# Patient Record
Sex: Female | Born: 2000
Health system: Southern US, Community
[De-identification: ages and names within clinical notes are randomized; demographics above are authoritative.]

## PROBLEM LIST (undated history)

## (undated) DIAGNOSIS — F32A Depression, unspecified: Secondary | ICD-10-CM

## (undated) DIAGNOSIS — J45909 Unspecified asthma, uncomplicated: Secondary | ICD-10-CM

## (undated) DIAGNOSIS — F419 Anxiety disorder, unspecified: Secondary | ICD-10-CM

## (undated) DIAGNOSIS — N83209 Unspecified ovarian cyst, unspecified side: Secondary | ICD-10-CM

## (undated) DIAGNOSIS — G8929 Other chronic pain: Secondary | ICD-10-CM

## (undated) DIAGNOSIS — R102 Pelvic and perineal pain unspecified side: Secondary | ICD-10-CM

## (undated) DIAGNOSIS — E282 Polycystic ovarian syndrome: Secondary | ICD-10-CM

## (undated) HISTORY — DX: Pelvic and perineal pain unspecified side: R10.20

## (undated) HISTORY — DX: Other chronic pain: G89.29

## (undated) HISTORY — DX: Anxiety disorder, unspecified: F41.9

## (undated) HISTORY — PX: ADENOIDECTOMY: SUR15

## (undated) HISTORY — PX: TONSILLECTOMY: SUR1361

## (undated) HISTORY — PX: OVARIAN CYST SURGERY: SHX726

## (undated) HISTORY — DX: Depression, unspecified: F32.A

## (undated) HISTORY — DX: Unspecified ovarian cyst, unspecified side: N83.209

---
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
## 2021-01-12 MED FILL — DULOXETINE HCL 60 MG CPEP: 60 | 30 days supply | Qty: 30 | Fill #6

## 2021-01-13 ENCOUNTER — Other Ambulatory Visit (HOSPITAL_BASED_OUTPATIENT_CLINIC_OR_DEPARTMENT_OTHER): Payer: Self-pay

## 2021-01-17 ENCOUNTER — Encounter: Payer: Self-pay | Admitting: Medical-Surgical

## 2021-01-17 ENCOUNTER — Telehealth: Payer: Self-pay

## 2021-01-17 NOTE — Telephone Encounter (Signed)
Pt aware of Beth Holmes's response. I asked if she would like to get worked in on [facility] schedule tomorrow and she declined. I told her that we would be able to address the injury and HA, but she really needed to contact her GYN about the heavy bleeding. She said that when she called GYN they told her to call her provider in [place] since they will be doing surgery, but states when she called them they told her to see [place]. I told her we could see her tomorrow for the head injury and HA but she really needed to reach out to Gyn. Pt again declined scheduling appt with Beth Holmes tomorrow. No further questions or concerns at this time.

## 2021-01-17 NOTE — Telephone Encounter (Signed)
Pt called and stated that she fell yesterday around noon and hit her head. She states she had a HA, but then around 4:30 PM she started having heavy menstrual bleeding. She has not missed a period but states she did have unprotected sex last week. Her next cycle is supposed to start around 01/20/2021. She said that she is passing clots, and has bleed through a tampon, a pad, and onto her pants. She states she has gone through a pad within the last hour. She is also complaining of abdominal pain. I asked her and she said she did call her GYN and they told her to call [facility] and they then instructed her to call her PCP due to hitting her head. Pt states she is not able to call out of work because "they will not believe me". I told her that there were not any appointments available this afternoon, and [facility] did not have anything available tomorrow, but I would send an urgent message to [facility] and see what she says.

## 2021-01-17 NOTE — Telephone Encounter (Signed)
We can certainly see her for the fall and hitting her head. OB/GYN should be seeing her for the excessive bleeding. These are two separate issues that are unfortunately occurring at the same time. The fall and head bump are not the likely cause her heavy bleeding issues. If she needs to come in for evaluation, we can work her in tomorrow. I would urge her to call OB/GYN back as they are going to have more input on the vaginal bleeding issue.

## 2021-01-18 NOTE — Telephone Encounter (Signed)
This encounter was created in error - please disregard.

## 2021-01-19 ENCOUNTER — Emergency Department (HOSPITAL_COMMUNITY)
Admission: EM | Admit: 2021-01-19 | Discharge: 2021-01-20 | Disposition: A | Discharge status: Home / Self Care | Payer: Managed Care, Other (non HMO) | Attending: Emergency Medicine | Admitting: Emergency Medicine

## 2021-01-19 ENCOUNTER — Ambulatory Visit (INDEPENDENT_AMBULATORY_CARE_PROVIDER_SITE_OTHER): Payer: Managed Care, Other (non HMO)

## 2021-01-19 ENCOUNTER — Other Ambulatory Visit: Payer: Self-pay

## 2021-01-19 ENCOUNTER — Encounter (HOSPITAL_COMMUNITY): Payer: Self-pay | Admitting: Emergency Medicine

## 2021-01-19 ENCOUNTER — Other Ambulatory Visit: Payer: Self-pay | Admitting: Obstetrics and Gynecology

## 2021-01-19 DIAGNOSIS — S0990XA Unspecified injury of head, initial encounter: Secondary | ICD-10-CM | POA: Diagnosis present

## 2021-01-19 DIAGNOSIS — W01198A Fall on same level from slipping, tripping and stumbling with subsequent striking against other object, initial encounter: Secondary | ICD-10-CM | POA: Diagnosis not present

## 2021-01-19 DIAGNOSIS — N939 Abnormal uterine and vaginal bleeding, unspecified: Secondary | ICD-10-CM | POA: Insufficient documentation

## 2021-01-19 DIAGNOSIS — J45909 Unspecified asthma, uncomplicated: Secondary | ICD-10-CM | POA: Diagnosis not present

## 2021-01-19 DIAGNOSIS — Y92002 Bathroom of unspecified non-institutional (private) residence single-family (private) house as the place of occurrence of the external cause: Secondary | ICD-10-CM | POA: Insufficient documentation

## 2021-01-19 DIAGNOSIS — S060X0A Concussion without loss of consciousness, initial encounter: Secondary | ICD-10-CM

## 2021-01-19 LAB — CBC WITH DIFFERENTIAL/PLATELET
Abs Immature Granulocytes: 0.02 10*3/uL (ref 0.00–0.07)
Basophils Absolute: 0 10*3/uL (ref 0.0–0.1)
Basophils Relative: 0 %
Eosinophils Absolute: 0.1 10*3/uL (ref 0.0–0.5)
Eosinophils Relative: 1 %
HCT: 38.4 % (ref 36.0–46.0)
Hemoglobin: 12.3 g/dL (ref 12.0–15.0)
Immature Granulocytes: 0 %
Lymphocytes Relative: 40 %
Lymphs Abs: 3.5 10*3/uL (ref 0.7–4.0)
MCH: 25.7 pg — ABNORMAL LOW (ref 26.0–34.0)
MCHC: 32 g/dL (ref 30.0–36.0)
MCV: 80.3 fL (ref 80.0–100.0)
Monocytes Absolute: 0.8 10*3/uL (ref 0.1–1.0)
Monocytes Relative: 9 %
Neutro Abs: 4.4 10*3/uL (ref 1.7–7.7)
Neutrophils Relative %: 50 %
Platelets: 285 10*3/uL (ref 150–400)
RBC: 4.78 MIL/uL (ref 3.87–5.11)
RDW: 13.2 % (ref 11.5–15.5)
WBC: 8.7 10*3/uL (ref 4.0–10.5)
nRBC: 0 % (ref 0.0–0.2)

## 2021-01-19 LAB — I-STAT BETA HCG BLOOD, ED (MC, WL, AP ONLY): I-stat hCG, quantitative: <5 m[IU]/mL (ref <5)

## 2021-01-19 LAB — COMPREHENSIVE METABOLIC PANEL
ALT: 30 U/L (ref 0–44)
AST: 20 U/L (ref 15–41)
Albumin: 3.6 g/dL (ref 3.5–5.0)
Alkaline Phosphatase: 69 U/L (ref 38–126)
Anion gap: 9 (ref 5–15)
BUN: 14 mg/dL (ref 6–20)
CO2: 24 mmol/L (ref 22–32)
Calcium: 8.8 mg/dL — ABNORMAL LOW (ref 8.9–10.3)
Chloride: 106 mmol/L (ref 98–111)
Creatinine, Ser: 0.69 mg/dL (ref 0.44–1.00)
GFR, Estimated: >60 mL/min (ref >60)
Glucose, Bld: 87 mg/dL (ref 70–99)
Potassium: 3.5 mmol/L (ref 3.5–5.1)
Sodium: 139 mmol/L (ref 135–145)
Total Bilirubin: 0.3 mg/dL (ref 0.3–1.2)
Total Protein: 6.6 g/dL (ref 6.5–8.1)

## 2021-01-19 LAB — TYPE AND SCREEN
ABO/RH(D): A POS
Antibody Screen: NEGATIVE

## 2021-01-19 NOTE — ED Triage Notes (Signed)
Emergency Medicine Provider Triage Evaluation Note  Beth Holmes , a 20 y.o. female  was evaluated in triage.  Pt complains of dizziness, lightheadedness and vaginal bleeding.  Had a mechanical fall about 4 days ago in her shower.  Larey Seat backwards and hit her head does not know if she lost consciousness.  Since then she has had heavy vaginal bleeding with passing clots.  States that her menstrual cycle started about 3 days early.  Reports abdominal cramping.  Review of Systems  Positive: Lightheadedness, vaginal bleeding Negative: Blurry vision  Physical Exam  BP (!) 151/111 (BP Location: Left Arm)   Pulse (!) 122   Temp 98.1 F (36.7 C) (Oral)   Resp 18   Ht 5\' 3"  (1.6 m)   Wt 95.3 kg   LMP 12/20/2020   SpO2 97%   BMI 37.20 kg/m  Gen:   Awake, no distress   HEENT:  Atraumatic  Resp:  Normal effort  Cardiac:  Tachycardic Abd:   Nondistended, nontender  MSK:   Moves extremities without difficulty Neuro:  Speech clear, strength 5/5 in bilateral upper and lower extremities no facial asymmetry  Medical Decision Making  Medically screening exam initiated at 8:55 PM.  Appropriate orders placed.  Beth Holmes was informed that the remainder of the evaluation will be completed by another provider, this initial triage assessment does not replace that evaluation, and the importance of remaining in the ED until their evaluation is complete.  Clinical Impression  20 year old female presenting to the ED after head injury 4 days ago.  Since then she had heavy vaginal bleeding.  Reports abdominal cramping as well.  No gross neurological deficits.  She had a pelvic ultrasound today which I reviewed which showed no abnormalities, endometrial stripe measuring 5.2 mm in thickness.  Will obtain lab work, orthostatic vital signs.   15, PA-C 01/19/21 2106

## 2021-01-19 NOTE — ED Triage Notes (Signed)
Pt states that she fell and hit the back of her head on Monday. Does not remember the fall and "thinks that she passed out." That night she started bleeding vaginally. Reports that it is much heavier than her normal period. States that she is going through a pad per hour and seeing a lot of clots. Reports that she still feels "woozy" as well.

## 2021-01-20 ENCOUNTER — Ambulatory Visit (HOSPITAL_COMMUNITY): Admission: RE | Admit: 2021-01-20 | Payer: Managed Care, Other (non HMO) | Source: Ambulatory Visit

## 2021-01-20 ENCOUNTER — Other Ambulatory Visit (HOSPITAL_BASED_OUTPATIENT_CLINIC_OR_DEPARTMENT_OTHER): Payer: Self-pay

## 2021-01-20 LAB — WET PREP, GENITAL
Clue Cells Wet Prep HPF POC: NONE SEEN
Sperm: NONE SEEN
Trich, Wet Prep: NONE SEEN
Yeast Wet Prep HPF POC: NONE SEEN

## 2021-01-20 LAB — CBG MONITORING, ED: Glucose-Capillary: 82 mg/dL (ref 70–99)

## 2021-01-20 LAB — ABO/RH: ABO/RH(D): A POS

## 2021-01-20 MED ORDER — ONDANSETRON HCL 4 MG PO TABS
4.0000 mg | ORAL_TABLET | Freq: Three times a day (TID) | ORAL | 0 refills | Status: DC | PRN
  Filled 2021-01-20: qty 10, 4d supply, fill #0

## 2021-01-20 MED ORDER — ONDANSETRON 4 MG PO TBDP
4.0000 mg | ORAL_TABLET | Freq: Once | ORAL | Status: AC
  Administered 2021-01-20: 4 mg via ORAL
  Filled 2021-01-20: qty 1

## 2021-01-20 MED ORDER — KETOROLAC TROMETHAMINE 60 MG/2ML IM SOLN
60.0000 mg | Freq: Once | INTRAMUSCULAR | Status: AC
  Administered 2021-01-20: 60 mg via INTRAMUSCULAR
  Filled 2021-01-20: qty 2

## 2021-01-20 NOTE — Discharge Instructions (Addendum)
1. Medications: Ibuprofen or Tylenol for pain °2. Treatment: Rest, ice on head.  Concussion precautions given - keep patient in a quiet, not simulating, dark environment. No TV, computer use, video games until headache is resolved completely. No contact sports until cleared by the pediatrician. °3. Follow Up: With primary care physician on Monday if headache persists.  Return to the emergency department if patient becomes lethargic, begins vomiting, develops double vision, speech difficulty, problems walking or other change in mental status. ° °

## 2021-01-20 NOTE — ED Notes (Signed)
Discharged no concerns 

## 2021-01-20 NOTE — ED Provider Notes (Addendum)
[facility]-EMERGENCY DEPT Provider Note   CSN: [id number] Arrival date & time: 01/19/21  2036     History Chief Complaint  Patient presents with  . Head Injury  . Vaginal Bleeding    Beth Holmes is a 20 y.o. female presents to the Emergency Department complaining of gradual, persistent, vaginal bleeding onset 01/17/2021. Associated symptoms include lower abdominal cramping. Pt also with N/V/D on 4/6 which she attributed to a gastroenteritis.  This has resolved and no additional vomiting. No aggravating factors.  Alleviating factors include heating pad, motrin.  Pt denies fever, chills, neck pain, chest pain, SOB, dysuria, syncope.  Pt reports she slipped in the bathroom and fell on 4/5, hitting the back of her head.  No LOC.  Pt reports after headaches, dizziness, photophobia and it has been hard to focus.  No vision changes or diplopia.  Pt denies blood thinner.  No vomiting aside from the nausea vomiting diarrhea that she attributed to gastroenteritis the day after the fall.  Patient's mother provides additional history.   The history is provided by the patient, medical records and a parent. No language interpreter was used.       Past Medical History:  Diagnosis Date  . Anxiety   . Asthma   . Chronic pelvic pain in female   . Depression   . Ovarian cyst    rt side    Patient Active Problem List   Diagnosis Date Noted  . 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 Bladder pain 05/08/2020  . Anxiety 05/08/2020  . Chronic female pelvic pain 12/06/2019  . Metrorrhagia 03/21/2016  . Aphthous ulcer of mouth 12/03/2015  . Obesity, unspecified 09/17/2007    Past Surgical History:  Procedure Laterality Date  . ADENOIDECTOMY    . OVARIAN CYST SURGERY    . TONSILLECTOMY    . UPPER GASTROINTESTINAL ENDOSCOPY  08/11/2020     OB History    Gravida  0   Para  0   Term  0   Preterm  0   AB  0   Living  0     SAB  0   IAB  0    Ectopic  0   Multiple  0   Live Births  0           Family History  Problem Relation Age of Onset  . Thyroid disease Father   . Lung cancer Father   . Throat cancer Father   . Bone cancer Father   . Diabetes Maternal Grandmother   . Hypertension Maternal Grandmother   . Diabetes Maternal Grandfather   . Hypertension Maternal Grandfather   . Hypertension Paternal Grandmother   . Colon cancer Paternal Grandmother   . Hypertension Paternal Grandfather   . Ovarian cancer Paternal Aunt   . Crohn's disease Neg Hx   . Inflammatory bowel disease Neg Hx   . Food intolerance Neg Hx   . Celiac disease Neg Hx   . GER disease Neg Hx   . Migraines Neg Hx   . Esophageal cancer Neg Hx   . Stomach cancer Neg Hx   . Rectal cancer Neg Hx     Social History   Tobacco Use  . Smoking status: Never Smoker  . Smokeless tobacco: Never Used  Vaping Use  . Vaping Use: Never used  Substance Use Topics  . Alcohol use: Never  . Drug use: Never    Home Medications Prior to  Admission medications   Medication Sig Start Date End Date Taking? Authorizing Provider  DULoxetine (CYMBALTA) 60 MG capsule Take 60 mg by mouth daily.  06/26/20  Yes [provider]  ibuprofen (ADVIL,MOTRIN) 200 MG tablet Take 600 mg by mouth every 6 (six) hours as needed for mild pain.   Yes [provider]  ondansetron (ZOFRAN) 4 MG tablet Take 1 tablet (4 mg total) by mouth every 8 (eight) hours as needed for nausea or vomiting. 01/20/21  Yes Alajah Witman, Dahlia Client, PA-C  pantoprazole (PROTONIX) 40 MG tablet TAKE 1 TABLET (40 MG TOTAL) BY MOUTH 2 (TWO) TIMES DAILY. TAKE 40MG  TWICE DAILY FOR 6 WEEKS, THEN REDUCE TO ONCE DAILY 08/11/20 08/11/21 Yes Cirigliano, Vito V, DO  valACYclovir (VALTREX) 500 MG tablet TAKE 1 TABLET BY MOUTH ONCE DAILY 06/26/20 06/26/21 Yes 06/28/21, MD  dicyclomine (BENTYL) 10 MG capsule TAKE 1 CAPSULE BY MOUTH EVERY 8 HOURS AS NEEDED FOR SPASMS Patient not taking: Reported on  01/20/2021 08/01/20 08/01/21  Cirigliano, Vito V, DO  DULoxetine (CYMBALTA) 60 MG capsule TAKE 1 CAPSULE BY MOUTH ONCE DAILY Patient not taking: Reported on 01/20/2021 06/26/20 06/26/21  06/28/21, MD  Elagolix Sodium 200 MG TABS TAKE 1 TABLET BY MOUTH TWICE DAILY Patient not taking: Reported on 01/20/2021 07/17/20 07/17/21  Anyanwu, 09/16/21, MD  famotidine (PEPCID) 20 MG tablet TAKE 1 TABLET BY MOUTH 2 TIMES DAILY **NEED OFFICE VISIT FOR FURTHER REFILLS** Patient not taking: Reported on 01/20/2021 10/12/20 10/12/21  10/14/21, NP  gabapentin (NEURONTIN) 300 MG capsule TAKE 1 CAPSULE BY MOUTH AT NIGHT AND INCREASE AS TOLERATED AS DIRECTED Patient not taking: Reported on 01/20/2021 08/14/20 08/14/21  13/1/22, MD  Lidocaine HCl 2.5 % GEL Apply 1 application topically as needed. Patient not taking: Reported on 01/20/2021 02/03/20   02/05/20, MD  Meth-Hyo-M Lesly Dukes Phos-Ph Sal (URO-MP) 118 MG CAPS Take 1 capsule by mouth daily as needed. Patient not taking: Reported on 01/20/2021 06/23/20   [provider]  metroNIDAZOLE (FLAGYL) 500 MG tablet TAKE 1 TABLET (500 MG TOTAL) BY MOUTH 2 (TWO) TIMES DAILY FOR 7 DAYS. Patient not taking: Reported on 01/20/2021 11/28/20 11/28/21  11/30/21, NP  metroNIDAZOLE (METROGEL) 0.75 % vaginal gel APPLY ONE APPLICATORFUL TO VAGINA AT BEDTIME FOR 7 DAYS THEN TWICE A WEEK. Patient not taking: Reported on 01/20/2021 12/28/20 12/28/21  12/30/21, MD  pantoprazole (PROTONIX) 40 MG tablet TAKE 1 TABLET (40 MG TOTAL) BY MOUTH DAILY. 06/15/20 06/15/21  08/15/21, NP  promethazine (PHENERGAN) 12.5 MG tablet Take 1 tablet (12.5 mg total) by mouth every 8 (eight) hours as needed for nausea or vomiting. Patient not taking: Reported on 01/20/2021 06/15/20   08/15/20, NP  norethindrone (AYGESTIN) 5 MG tablet Take 1 tablet (5 mg total) by mouth daily. 05/08/20 10/23/20  12/21/20, MD    Allergies    Patient has no known allergies.  Review of Systems   Review  of Systems  Constitutional: Negative for appetite change, diaphoresis, fatigue, fever and unexpected weight change.  HENT: Negative for mouth sores.   Eyes: Positive for photophobia. Negative for visual disturbance.  Respiratory: Negative for cough, chest tightness, shortness of breath and wheezing.   Cardiovascular: Negative for chest pain.  Gastrointestinal: Positive for diarrhea, nausea and vomiting. Negative for abdominal pain and constipation.  Endocrine: Negative for polydipsia, polyphagia and polyuria.  Genitourinary: Positive for pelvic pain and vaginal bleeding. Negative for dysuria, frequency, hematuria and  urgency.  Musculoskeletal: Negative for back pain and neck stiffness.  Skin: Negative for rash.  Allergic/Immunologic: Negative for immunocompromised state.  Neurological: Positive for light-headedness and headaches. Negative for syncope.  Hematological: Does not bruise/bleed easily.  Psychiatric/Behavioral: Negative for sleep disturbance. The patient is not nervous/anxious.     Physical Exam Updated Vital Signs BP 111/78   Pulse 88   Temp 98.1 F (36.7 C) (Oral)   Resp 20   Ht 5\' 3"  (1.6 m)   Wt 95.3 kg   SpO2 100%   BMI 37.20 kg/m   Physical Exam Vitals and nursing note reviewed. Exam conducted with a chaperone present.  Constitutional:      General: She is not in acute distress.    Appearance: She is well-developed. She is not diaphoretic.  HENT:     Head: Normocephalic and atraumatic.  Eyes:     General: No scleral icterus.    Conjunctiva/sclera: Conjunctivae normal.     Pupils: Pupils are equal, round, and reactive to light.     Comments: No horizontal, vertical or rotational nystagmus  Neck:     Comments: Full active and passive ROM without pain No midline or paraspinal tenderness No nuchal rigidity or meningeal signs Cardiovascular:     Rate and Rhythm: Normal rate and regular rhythm.     Pulses: Normal pulses.  Pulmonary:     Effort: Pulmonary  effort is normal. No respiratory distress.     Breath sounds: Normal breath sounds. No wheezing or rales.  Abdominal:     General: Bowel sounds are normal.     Palpations: Abdomen is soft.     Tenderness: There is no abdominal tenderness. There is no right CVA tenderness, left CVA tenderness, guarding or rebound.     Hernia: There is no hernia in the left inguinal area or right inguinal area.  Genitourinary:    Exam position: Supine.     Labia:        Right: No lesion.        Left: No lesion.      Vagina: Normal.     Cervix: Cervical bleeding (small amount of dark red blood) present.     Uterus: Normal.      Adnexa: Right adnexa normal and left adnexa normal.  Musculoskeletal:        General: Normal range of motion.     Cervical back: Normal, normal range of motion and neck supple.     Thoracic back: Normal.     Lumbar back: Normal.  Lymphadenopathy:     Cervical: No cervical adenopathy.     Lower Body: No right inguinal adenopathy. No left inguinal adenopathy.  Skin:    General: Skin is warm and dry.     Findings: No rash.  Neurological:     Mental Status: She is alert and oriented to person, place, and time.     Cranial Nerves: No cranial nerve deficit.     Motor: No abnormal muscle tone.     Coordination: Coordination normal.     Comments: Mental Status:  Alert, oriented, thought content appropriate. Speech fluent without evidence of aphasia. Able to follow 2 step commands without difficulty.  Cranial Nerves:  II:  Peripheral visual fields grossly normal, pupils equal, round, reactive to light III,IV, VI: ptosis not present, extra-ocular motions intact bilaterally  V,VII: smile symmetric, facial light touch sensation equal VIII: hearing grossly normal bilaterally  IX,X: midline uvula rise  XI: bilateral shoulder shrug equal and strong XII:  midline tongue extension  Motor:  5/5 in upper and lower extremities bilaterally including strong and equal grip strength and  dorsiflexion/plantar flexion Sensory: Pinprick and light touch normal in all extremities.  Cerebellar: normal finger-to-nose with bilateral upper extremities Gait: normal gait and balance CV: distal pulses palpable throughout   Psychiatric:        Behavior: Behavior normal.        Thought Content: Thought content normal.        Judgment: Judgment normal.     ED Results / Procedures / Treatments   Labs (all labs ordered are listed, but only abnormal results are displayed) Labs Reviewed  WET PREP, GENITAL - Abnormal; Notable for the following components:      Result Value   WBC, Wet Prep HPF POC MODERATE (*)    All other components within normal limits  COMPREHENSIVE METABOLIC PANEL - Abnormal; Notable for the following components:   Calcium 8.8 (*)    All other components within normal limits  CBC WITH DIFFERENTIAL/PLATELET - Abnormal; Notable for the following components:   MCH 25.7 (*)    All other components within normal limits  I-STAT BETA HCG BLOOD, ED ([facility], [facility], [facility] ONLY)  CBG MONITORING, ED  TYPE AND SCREEN  ABO/RH  GC/CHLAMYDIA PROBE AMP (Christine) NOT AT [facility]       Radiology US PELVIS TRANSVAGINAL NON-OB (TV ONLY)  Result Date: 01/19/2021 CLINICAL DATA:  Initial evaluation for abnormal uterine bleeding. EXAM: ULTRASOUND PELVIS TRANSVAGINAL TECHNIQUE: Transvaginal ultrasound examination of the pelvis was performed including evaluation of the uterus, ovaries, adnexal regions, and pelvic cul-de-sac. COMPARISON:  Prior ultrasound from 07/19/2020. FINDINGS: Uterus Measurements: 5.9 x 3.6 x 5.0 cm = volume: 53 mL. Uterus is retroflexed. No discrete fibroid or other mass. Endometrium Thickness: 5.2 mm.  No focal abnormality visualized. Right ovary Measurements: 2.8 x 1.9 x 1.9 cm = volume: 5.0 mL. Normal appearance/no adnexal mass. 2.3 x 0.8 x 1.5 cm collapsing dominant follicle noted. Left ovary Measurements: 3.5 x 1.6 x 1.8 cm = volume: 5.0 mL. Normal appearance/no adnexal  mass. Other findings: Trace free physiologic fluid noted within the pelvis. IMPRESSION: 1. Endometrial stripe measures 5.2 mm in thickness without focal abnormality. If bleeding remains unresponsive to hormonal or medical therapy, sonohysterogram should be considered for focal lesion work-up. (Ref: Radiological Reasoning: Algorithmic Workup of Abnormal Vaginal Bleeding with Endovaginal Sonography and Sonohysterography. AJR 2008; 295:A21-30). 2. Otherwise normal sonographic appearance of the uterus. 3. Normal ovaries for age.  No other acute abnormality. Electronically Signed   By: Rise Mu M.D.   On: 01/19/2021 14:11    Procedures Procedures   Medications Ordered in ED Medications  ondansetron (ZOFRAN-ODT) disintegrating tablet 4 mg (has no administration in time range)  ketorolac (TORADOL) injection 60 mg (60 mg Intramuscular Given 01/20/21 0318)    ED Course  I have reviewed the triage vital signs and the nursing notes.  Pertinent labs & imaging results that were available during my care of the patient were reviewed by me and considered in my medical decision making (see chart for details).    MDM Rules/Calculators/A&P                           Patient presents with several complaints.  She fell 4 days ago striking her head.  No loss of consciousness at that time.  The time she has had ongoing headaches, lightheadedness, light sensitivity.  She reports she is been  able to walk without difficulty.  Had some nausea and vomiting the day after but this resolved completely and she has been able to eat and drink normally since that time.  Normal neurologic exam here.  No contusion or ecchymosis to the posterior occiput.  No midline tenderness to the cervical thoracic or lumbar spine.  Highly doubt intracranial hemorrhage however patient is concerned.  Will obtain CT scan.  Patient also with vaginal bleeding which began after the fall.  She began her menstrual cycle 3 days early.  She is  a history of chronic pelvic pain and reports some abdominal cramping today.  She reports the first 2 days were very heavy with dark blood and clots but this has improved over the last several days.  Patient called her OB/GYN who recommended she be evaluated here in the emergency department given the head injury.  Records reviewed: Patient is being followed by Dr. Iona Hansen at [facility].  They are planning for surgery to include pelvic exam under anesthesia, laparoscopic excision decision of endometriosis, possible appendectomy, possible right ovarian cystectomy, chromopertubation of fallopian tubes, Mirena IUD and other indicated procedures.  Patient reports her pain today is not significantly worse than previous.  Exam reassuring.  No adnexal or cervical motion tenderness.  Small amount of dark blood noted at the cervical os and in the vaginal vault but no excessive bleeding.  No anemia on blood work today.  Patient is stable for follow-up with OB/GYN.  3:18 AM Discussed with patient.  She no longer wishes to wait for CT scan.  She remains neurologically intact and ambulatory without difficulty here in the emergency department.  She is tolerating p.o.  Less likely to be intracranial hemorrhage however have discussed the risk of disability or death if she has an undiagnosed intracranial hemorrhage.  Patient states understanding and reports she will follow up with primary care ASAP.  Patient informed she may return to the emergency department at any time for further evaluation and treatment.  Concussion precautions given.   Final Clinical Impression(s) / ED Diagnoses Final diagnoses:  Vaginal bleeding  Concussion without loss of consciousness, initial encounter    Rx / DC Orders ED Discharge Orders         Ordered    ondansetron (ZOFRAN) 4 MG tablet  Every 8 hours PRN        01/20/21 0319           Winona Sison, Dahlia Client, PA-C 01/20/21 0320    Kurk Corniel, Dahlia Client, PA-C 01/20/21 0346     Nira Conn, MD 01/21/21 1827

## 2021-01-22 ENCOUNTER — Other Ambulatory Visit (HOSPITAL_BASED_OUTPATIENT_CLINIC_OR_DEPARTMENT_OTHER): Payer: Self-pay

## 2021-01-22 LAB — GC/CHLAMYDIA PROBE AMP (~~LOC~~) NOT AT ARMC
Chlamydia: NEGATIVE
Comment: NEGATIVE
Comment: NORMAL
Neisseria Gonorrhea: NEGATIVE

## 2021-01-23 ENCOUNTER — Other Ambulatory Visit (HOSPITAL_BASED_OUTPATIENT_CLINIC_OR_DEPARTMENT_OTHER): Payer: Self-pay

## 2021-02-12 MED FILL — Valacyclovir HCl Tab 500 MG: ORAL | 30 days supply | Qty: 30 | Fill #0 | Status: AC

## 2021-02-13 ENCOUNTER — Other Ambulatory Visit (HOSPITAL_BASED_OUTPATIENT_CLINIC_OR_DEPARTMENT_OTHER): Payer: Self-pay

## 2021-02-13 MED FILL — Gabapentin Cap 300 MG: ORAL | 30 days supply | Qty: 60 | Fill #0 | Status: AC

## 2021-02-14 ENCOUNTER — Other Ambulatory Visit (HOSPITAL_BASED_OUTPATIENT_CLINIC_OR_DEPARTMENT_OTHER): Payer: Self-pay

## 2021-02-14 IMAGING — US US TRANSVAGINAL NON-OB
1 series · 14 of 25 positions shown · non-contrast
Comparison: 12/06/2019

CLINICAL DATA: RIGHT ovarian cyst; LMP 12/09/2019

EXAM:
ULTRASOUND PELVIS TRANSVAGINAL
TECHNIQUE: Transvaginal ultrasound examination of the pelvis was performed
including evaluation of the uterus, ovaries, adnexal regions, and
pelvic cul-de-sac.

[Series 1: us transvaginal non-ob · 0.11mm/px · 53 acquisitions, 14 frames shown]
[im 1/53]
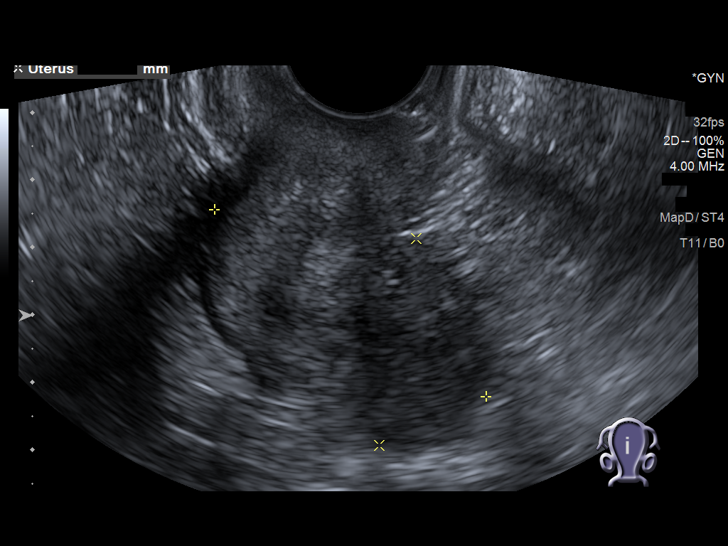
[im 5/53]
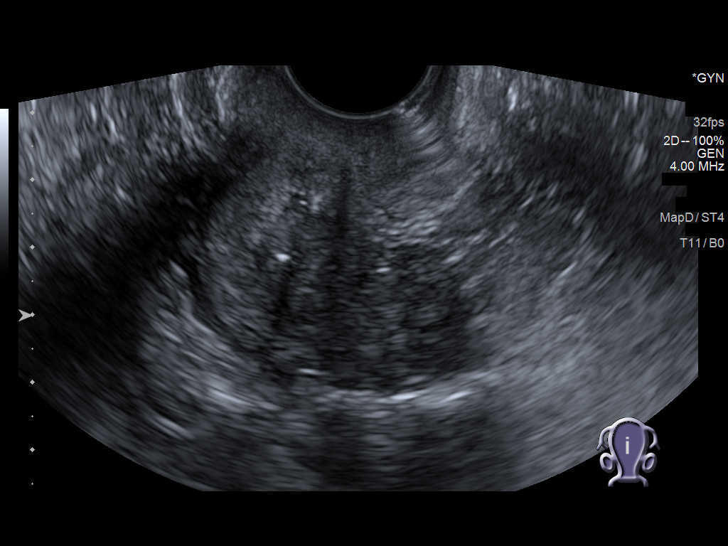
[im 9/53]
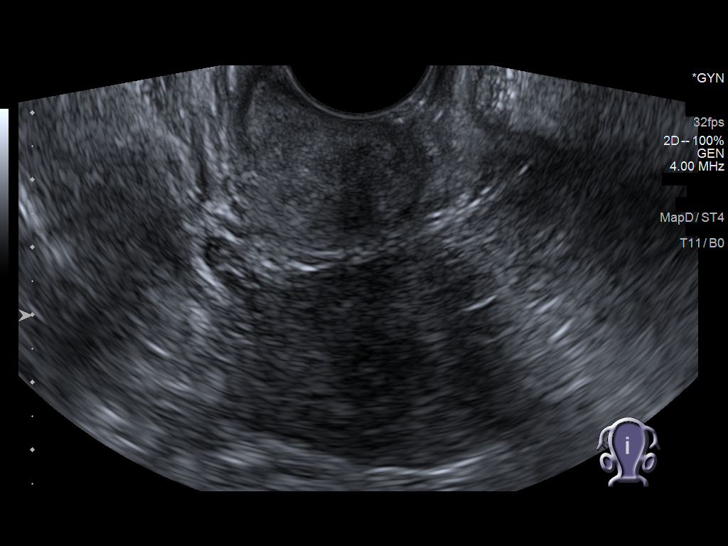
[im 14/53]
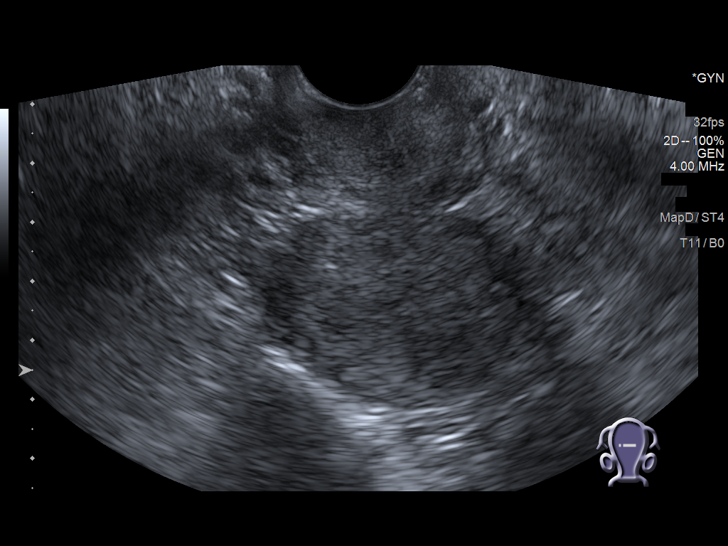
[im 18/53]
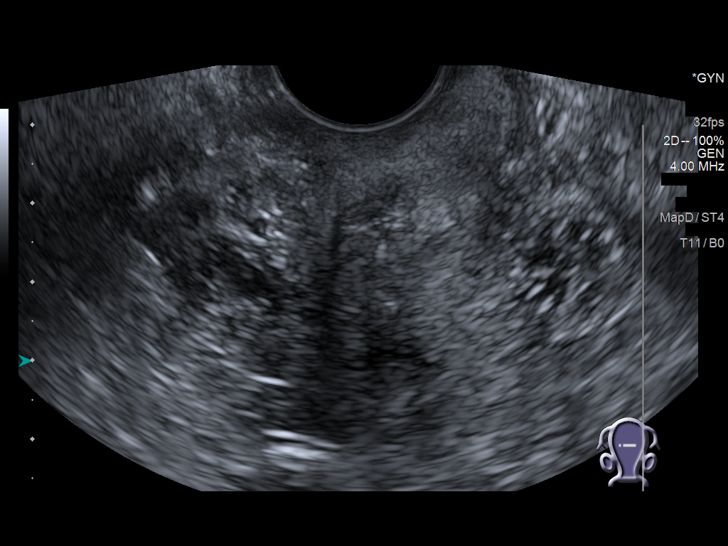
[im 20/53]
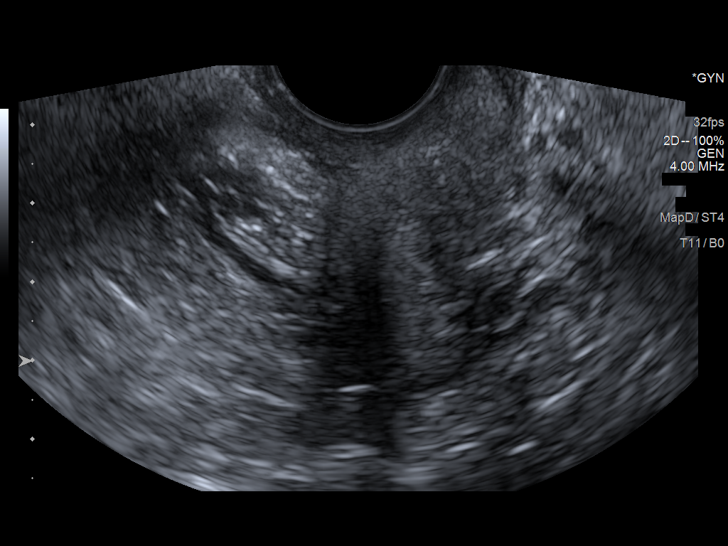
[im 24/53]
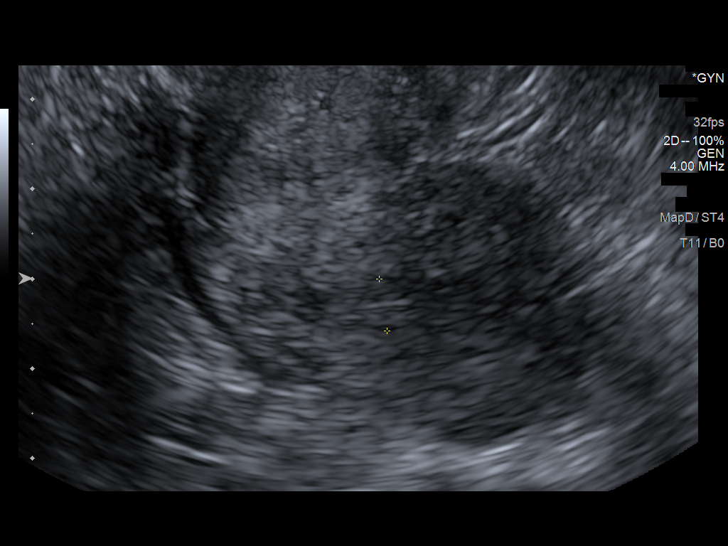
[im 29/53]
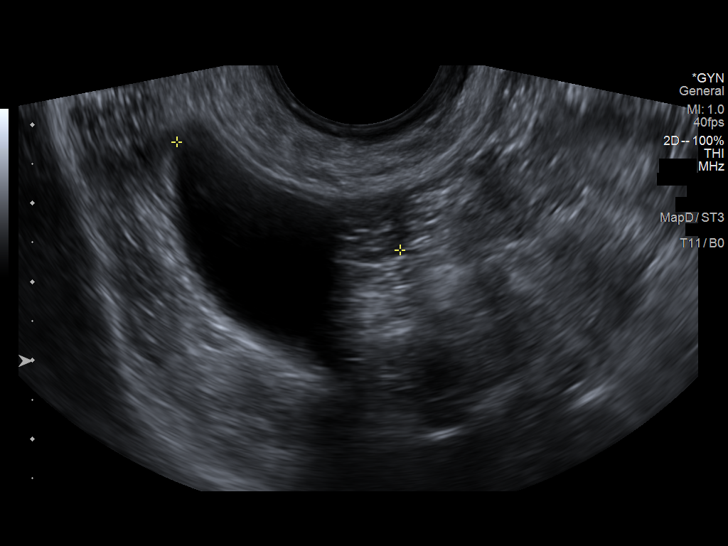
[im 33/53]
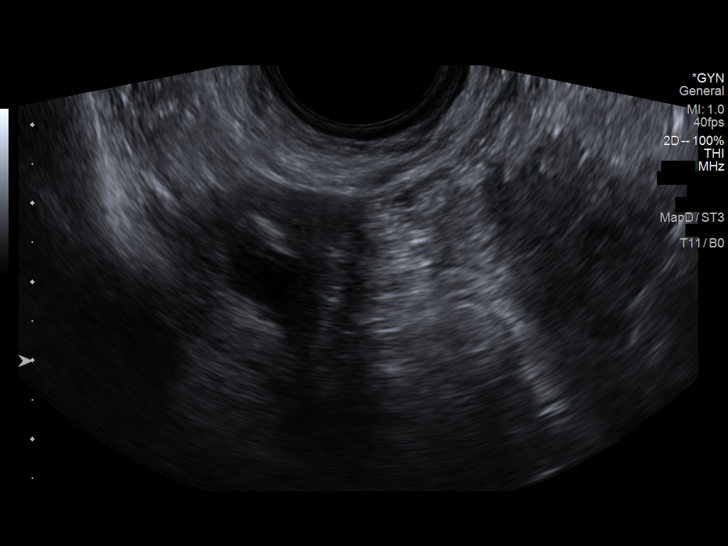
[im 35/53]
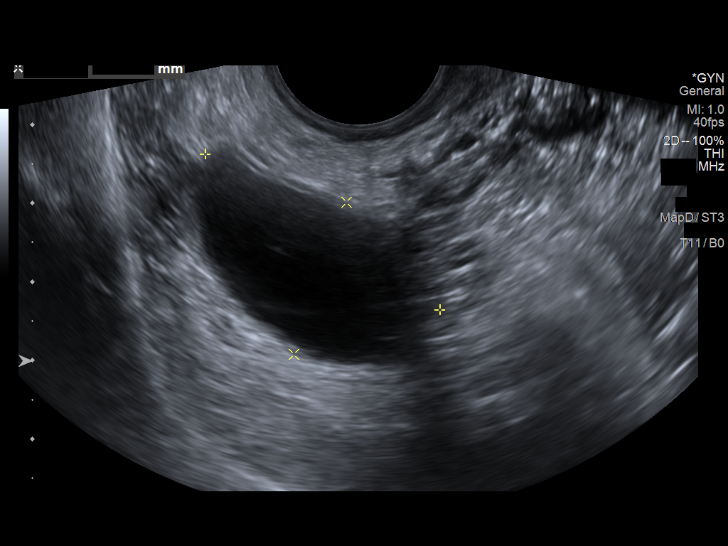
[im 40/53]
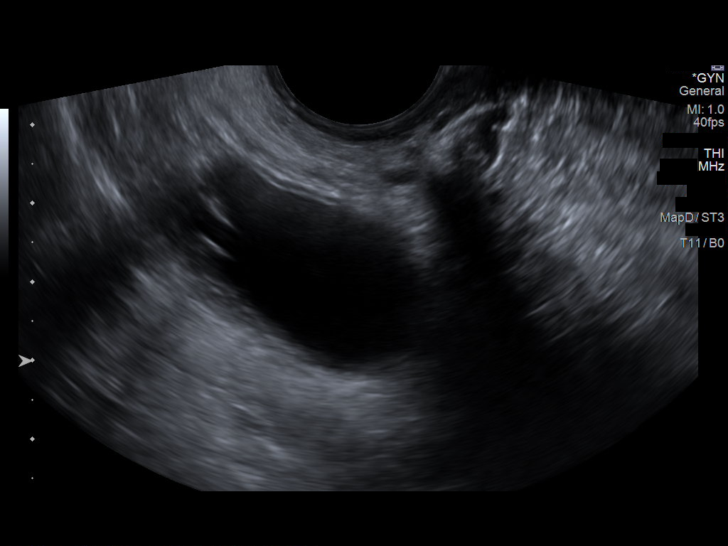
[im 44/53]
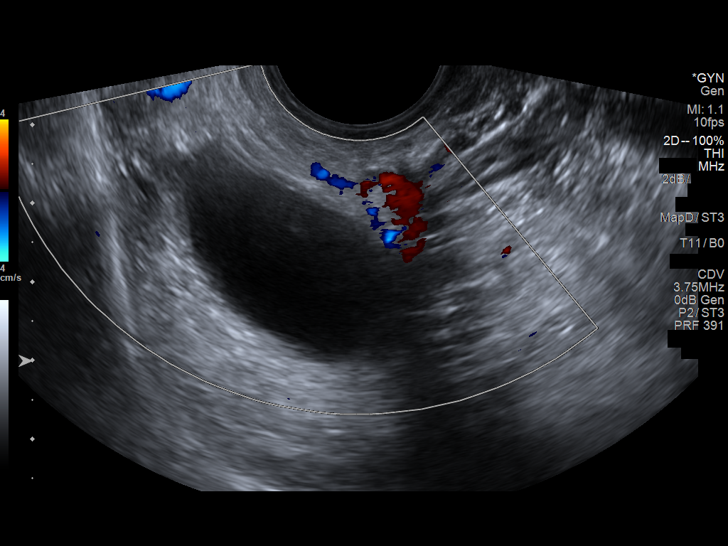
[im 48/53]
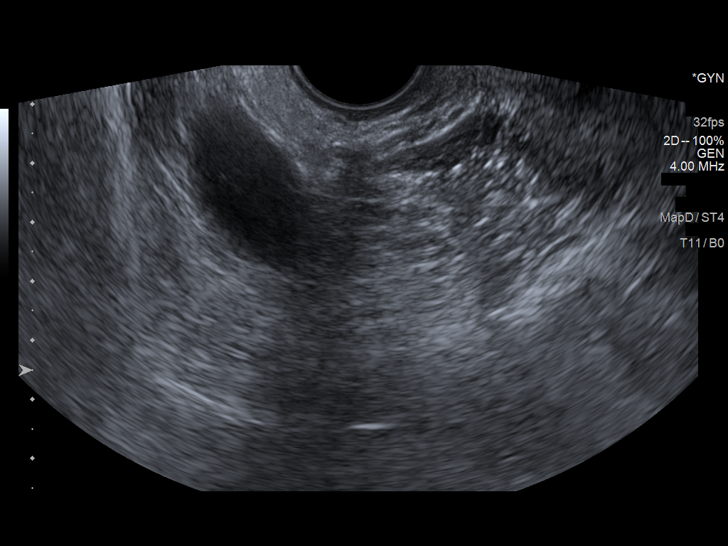
[im 53/53]
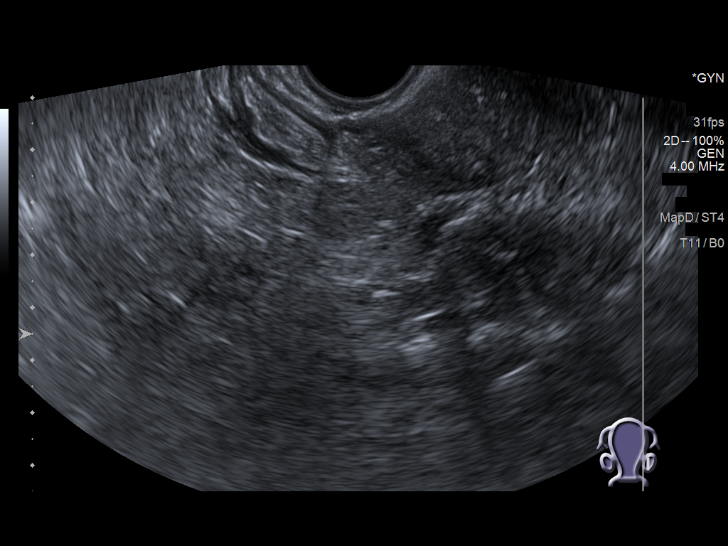

[14 of 25 positions shown; findings below may reference images not displayed]

FINDINGS: Uterus

Measurements: 4.9 x 3.1 x 4.1 cm = volume: 33 mL. Retroverted and
retroflexed. Heterogeneous myometrium. No focal mass.

Endometrium

Thickness: 6 mm. Poorly visualized/delineated. No endometrial fluid.

Right ovary

Measurements: 3.6 x 2.1 x 3.2 cm = volume: 13 mL. Minimally
complicated within RIGHT ovary 3.2 x 1.5 x 2.7 cm containing a
single thin partial septation. No definite mural nodularity.

Left ovary

Not visualized, likely obscured by bowel

Other findings: No free pelvic fluid. No adnexal masses otherwise
seen.
IMPRESSION: No definite uterine or endometrial abnormalities.

Nonvisualization of LEFT ovary.

3.2 cm diameter mature follicle versus minimally complicated cyst of
the RIGHT ovary containing a single thin partial septation.

## 2021-02-15 ENCOUNTER — Other Ambulatory Visit: Payer: Self-pay | Admitting: Obstetrics & Gynecology

## 2021-02-15 MED FILL — Metronidazole Vaginal Gel 0.75%: VAGINAL | 30 days supply | Qty: 70 | Fill #0 | Status: AC

## 2021-02-16 ENCOUNTER — Other Ambulatory Visit (HOSPITAL_BASED_OUTPATIENT_CLINIC_OR_DEPARTMENT_OTHER): Payer: Self-pay

## 2021-02-19 ENCOUNTER — Other Ambulatory Visit: Payer: Self-pay | Admitting: Obstetrics & Gynecology

## 2021-02-19 ENCOUNTER — Other Ambulatory Visit (HOSPITAL_BASED_OUTPATIENT_CLINIC_OR_DEPARTMENT_OTHER): Payer: Self-pay

## 2021-02-19 MED ORDER — DULOXETINE HCL 60 MG PO CPEP
60.0000 mg | ORAL_CAPSULE | Freq: Every day | ORAL | 6 refills | Status: DC
  Filled 2021-02-19: qty 30, 30d supply, fill #0
  Filled 2021-03-21: qty 30, 30d supply, fill #1

## 2021-02-19 MED ORDER — NORETHINDRONE ACETATE 5 MG PO TABS
ORAL_TABLET | ORAL | 3 refills | Status: DC
  Filled 2021-02-19: qty 30, 30d supply, fill #0
  Filled 2021-03-21: qty 30, 30d supply, fill #1

## 2021-02-20 ENCOUNTER — Other Ambulatory Visit (HOSPITAL_BASED_OUTPATIENT_CLINIC_OR_DEPARTMENT_OTHER): Payer: Self-pay

## 2021-03-21 MED FILL — Valacyclovir HCl Tab 500 MG: ORAL | 30 days supply | Qty: 30 | Fill #1 | Status: AC

## 2021-03-22 ENCOUNTER — Other Ambulatory Visit (HOSPITAL_BASED_OUTPATIENT_CLINIC_OR_DEPARTMENT_OTHER): Payer: Self-pay

## 2021-03-23 ENCOUNTER — Other Ambulatory Visit (HOSPITAL_BASED_OUTPATIENT_CLINIC_OR_DEPARTMENT_OTHER): Payer: Self-pay

## 2021-04-13 ENCOUNTER — Ambulatory Visit: Payer: Self-pay | Admitting: *Deleted

## 2021-04-13 ENCOUNTER — Other Ambulatory Visit: Payer: Self-pay

## 2021-04-13 ENCOUNTER — Emergency Department (HOSPITAL_BASED_OUTPATIENT_CLINIC_OR_DEPARTMENT_OTHER)
Admission: EM | Admit: 2021-04-13 | Discharge: 2021-04-14 | Disposition: A | Discharge status: Home / Self Care | Payer: Managed Care, Other (non HMO) | Attending: Emergency Medicine | Admitting: Emergency Medicine

## 2021-04-13 ENCOUNTER — Encounter (HOSPITAL_BASED_OUTPATIENT_CLINIC_OR_DEPARTMENT_OTHER): Payer: Self-pay

## 2021-04-13 DIAGNOSIS — R102 Pelvic and perineal pain: Secondary | ICD-10-CM | POA: Insufficient documentation

## 2021-04-13 DIAGNOSIS — N898 Other specified noninflammatory disorders of vagina: Secondary | ICD-10-CM | POA: Diagnosis present

## 2021-04-13 DIAGNOSIS — J45909 Unspecified asthma, uncomplicated: Secondary | ICD-10-CM | POA: Diagnosis not present

## 2021-04-13 DIAGNOSIS — N949 Unspecified condition associated with female genital organs and menstrual cycle: Secondary | ICD-10-CM

## 2021-04-13 DIAGNOSIS — R350 Frequency of micturition: Secondary | ICD-10-CM | POA: Diagnosis not present

## 2021-04-13 DIAGNOSIS — G8929 Other chronic pain: Secondary | ICD-10-CM | POA: Diagnosis not present

## 2021-04-13 LAB — PREGNANCY, URINE: Preg Test, Ur: NEGATIVE

## 2021-04-13 LAB — URINALYSIS, ROUTINE W REFLEX MICROSCOPIC
Bilirubin Urine: NEGATIVE
Glucose, UA: NEGATIVE mg/dL
Ketones, ur: NEGATIVE mg/dL
Leukocytes,Ua: NEGATIVE
Nitrite: NEGATIVE
Protein, ur: NEGATIVE mg/dL
Specific Gravity, Urine: >1.03 — ABNORMAL HIGH (ref 1.005–1.030)
pH: 6 (ref 5.0–8.0)

## 2021-04-13 LAB — URINALYSIS, MICROSCOPIC (REFLEX)

## 2021-04-13 NOTE — Telephone Encounter (Signed)
Caller hung up before NT on the line.

## 2021-04-13 NOTE — Telephone Encounter (Signed)
Pt is calling for advise - She feels as if she full bladder- and when she goes to urinate. It is only a little bit. Please advise  ** The message may be incomplete. It was sent as a result of a timeout  Called patient to review symptoms. No answer, left voicemail to call back .

## 2021-04-13 NOTE — ED Triage Notes (Signed)
Pt c/o vaginal d/c, vaginal pain, urinary freq and pressure x 3 days-NAD-steady gait

## 2021-04-14 DIAGNOSIS — N898 Other specified noninflammatory disorders of vagina: Secondary | ICD-10-CM | POA: Diagnosis not present

## 2021-04-14 LAB — WET PREP, GENITAL
Clue Cells Wet Prep HPF POC: NONE SEEN
Sperm: NONE SEEN
Trich, Wet Prep: NONE SEEN
Yeast Wet Prep HPF POC: NONE SEEN

## 2021-04-14 NOTE — Telephone Encounter (Signed)
Pt was seen and evaluated in ED yesterday after NT attempted to reach pt.  Answer Assessment - Initial Assessment Questions 1. SYMPTOM: "What's the main symptom you're concerned about?" (e.g., frequency, incontinence)     Unable to reach pt- see ED note  Protocols used: Urinary Symptoms-A-AH

## 2021-04-14 NOTE — ED Provider Notes (Signed)
[facility] Provider Note: Lowella Dell, MD, FACEP  CSN: [id number] MRN: [id number] ARRIVAL: 04/13/21 at 2234 ROOM: MH01/MH01   CHIEF COMPLAINT  Vaginal Discharge   HISTORY OF PRESENT ILLNESS  04/14/21 12:58 AM Beth Holmes is a 20 y.o. female with a history of chronic pelvic pain.  She has been having 3 days of vaginal pain and discharge.  She describes the pain as a burning but with urination it becomes a pressure.  She rates it as a 9 out of 10.  The pain is worse with movement or urination.  She describes the discharge is copious and mucoid.  She has also had urinary frequency.  She has surgery scheduled pending next month for ovarian cysts and endometriosis.   Past Medical History:  Diagnosis Date   Anxiety    Asthma    Chronic pelvic pain in female    Depression    Ovarian cyst    rt side    Past Surgical History:  Procedure Laterality Date   ADENOIDECTOMY     OVARIAN CYST SURGERY     TONSILLECTOMY     UPPER GASTROINTESTINAL ENDOSCOPY  08/11/2020    Family History  Problem Relation Age of Onset   Thyroid disease Father    Lung cancer Father    Throat cancer Father    Bone cancer Father    Diabetes Maternal Grandmother    Hypertension Maternal Grandmother    Diabetes Maternal Grandfather    Hypertension Maternal Grandfather    Hypertension Paternal Grandmother    Colon cancer Paternal Grandmother    Hypertension Paternal Grandfather    Ovarian cancer Paternal Aunt    Crohn's disease Neg Hx    Inflammatory bowel disease Neg Hx    Food intolerance Neg Hx    Celiac disease Neg Hx    GER disease Neg Hx    Migraines Neg Hx    Esophageal cancer Neg Hx    Stomach cancer Neg Hx    Rectal cancer Neg Hx     Social History   Tobacco Use   Smoking status: Never   Smokeless tobacco: Never  Vaping Use   Vaping Use: Never used  Substance Use Topics   Alcohol use: Never   Drug use: Never    Prior to Admission medications   Medication Sig  Start Date End Date Taking? Authorizing Provider  DULoxetine (CYMBALTA) 60 MG capsule Take 1 capsule (60 mg total) by mouth daily. 02/19/21   Lesly Dukes, MD  ibuprofen (ADVIL,MOTRIN) 200 MG tablet Take 600 mg by mouth every 6 (six) hours as needed for mild pain.    [provider]  norethindrone (AYGESTIN) 5 MG tablet Take 1 tablet by mouth daily 09/29/20     ondansetron (ZOFRAN) 4 MG tablet Take 1 tablet (4 mg total) by mouth every 8 (eight) hours as needed for nausea or vomiting. 01/20/21   Muthersbaugh, Dahlia Client, PA-C  pantoprazole (PROTONIX) 40 MG tablet TAKE 1 TABLET (40 MG TOTAL) BY MOUTH 2 (TWO) TIMES DAILY. TAKE 40MG  TWICE DAILY FOR 6 WEEKS, THEN REDUCE TO ONCE DAILY 08/11/20 08/11/21  Cirigliano, Vito V, DO  pantoprazole (PROTONIX) 40 MG tablet TAKE 1 TABLET (40 MG TOTAL) BY MOUTH DAILY. 06/15/20 06/15/21  08/15/21, NP  valACYclovir (VALTREX) 500 MG tablet TAKE 1 TABLET BY MOUTH ONCE DAILY 06/26/20 06/26/21  06/28/21, MD    Allergies Patient has no known allergies.   REVIEW OF SYSTEMS  Negative except as noted  here or in the History of Present Illness.   PHYSICAL EXAMINATION  Initial Vital Signs Blood pressure 125/80, pulse 93, temperature 98.3 F (36.8 C), temperature source Oral, resp. rate 20, height 5\' 3"  (1.6 m), weight 99.3 kg, SpO2 98 %.  Examination General: Well-developed, well-nourished female in no acute distress; appearance consistent with age of record HENT: normocephalic; atraumatic Eyes: Normal appearance Neck: supple Heart: regular rate and rhythm Lungs: clear to auscultation bilaterally Abdomen: soft; nondistended; mild suprapubic tenderness; bowel sounds present GU: Normal external genitalia; no vaginal bleeding; white vaginal discharge; mild cervical motion tenderness and mild bilateral adnexal tenderness Extremities: No deformity; full range of motion Neurologic: Awake, alert and oriented; motor function intact in all extremities and  symmetric; no facial droop Skin: Warm and dry Psychiatric: Normal mood and affect   RESULTS  Summary of this visit's results, reviewed and interpreted by myself:   EKG Interpretation  Date/Time:    Ventricular Rate:    PR Interval:    QRS Duration:   QT Interval:    QTC Calculation:   R Axis:     Text Interpretation:          Laboratory Studies: Results for orders placed or performed during the hospital encounter of 04/13/21 (from the past 24 hour(s))  Pregnancy, urine     Status: None   Collection Time: 04/13/21 10:45 PM  Result Value Ref Range   Preg Test, Ur NEGATIVE NEGATIVE  Urinalysis, Routine w reflex microscopic Urine, Clean Catch     Status: Abnormal   Collection Time: 04/13/21 10:45 PM  Result Value Ref Range   Color, Urine YELLOW YELLOW   APPearance HAZY (A) CLEAR   Specific Gravity, Urine >1.030 (H) 1.005 - 1.030   pH 6.0 5.0 - 8.0   Glucose, UA NEGATIVE NEGATIVE mg/dL   Hgb urine dipstick LARGE (A) NEGATIVE   Bilirubin Urine NEGATIVE NEGATIVE   Ketones, ur NEGATIVE NEGATIVE mg/dL   Protein, ur NEGATIVE NEGATIVE mg/dL   Nitrite NEGATIVE NEGATIVE   Leukocytes,Ua NEGATIVE NEGATIVE  Urinalysis, Microscopic (reflex)     Status: Abnormal   Collection Time: 04/13/21 10:45 PM  Result Value Ref Range   RBC / HPF 6-10 0 - 5 RBC/hpf   WBC, UA 0-5 0 - 5 WBC/hpf   Bacteria, UA MANY (A) NONE SEEN   Squamous Epithelial / LPF 0-5 0 - 5   Mucus PRESENT   Wet prep, genital     Status: Abnormal   Collection Time: 04/14/21  1:06 AM  Result Value Ref Range   Yeast Wet Prep HPF POC NONE SEEN NONE SEEN   Trich, Wet Prep NONE SEEN NONE SEEN   Clue Cells Wet Prep HPF POC NONE SEEN NONE SEEN   WBC, Wet Prep HPF POC FEW (A) NONE SEEN   Sperm NONE SEEN    Imaging Studies: No results found.  ED COURSE and MDM  Nursing notes, initial and subsequent vitals signs, including pulse oximetry, reviewed and interpreted by myself.  Vitals:   04/13/21 2242 04/13/21 2243  BP:   125/80  Pulse:  93  Resp:  20  Temp:  98.3 F (36.8 C)  TempSrc:  Oral  SpO2:  98%  Weight: 99.3 kg   Height: 5\' 3"  (1.6 m)    Medications - No data to display  The patient's wet prep is within normal limits.  Her vaginal discharge appears physiologic to me.  Her pelvic tenderness is not severe and there is no purulent discharge seen.  The patient would prefer to wait for GC/chlamydia results and avoid treatment at this time since the diagnosis has not been definitively made.  PROCEDURES  Procedures   ED DIAGNOSES     ICD-10-CM   1. Vaginal discomfort  N94.9          Jermal Dismuke, Jonny Ruiz, MD 04/14/21 [phone]

## 2021-04-17 ENCOUNTER — Telehealth: Payer: Self-pay

## 2021-04-17 ENCOUNTER — Other Ambulatory Visit: Payer: Managed Care, Other (non HMO)

## 2021-04-17 LAB — GC/CHLAMYDIA PROBE AMP (~~LOC~~) NOT AT ARMC
Chlamydia: NEGATIVE
Comment: NEGATIVE
Comment: NORMAL
Neisseria Gonorrhea: NEGATIVE

## 2021-04-17 NOTE — Telephone Encounter (Signed)
Transition Care Management Follow-up Telephone Call Date of discharge and from where: 04/14/2021 from [facility] How have you been since you were released from the hospital? Pt stated that she is still having symptoms.  Any questions or concerns? No  Items Reviewed: Did the pt receive and understand the discharge instructions provided? Yes  Medications obtained and verified? No  Other? No  Any new allergies since your discharge? No  Dietary orders reviewed? No Do you have support at home? Yes   Functional Questionnaire: (I = Independent and D = Dependent) ADLs: I  Bathing/Dressing- I  Meal Prep- I  Eating- I  Maintaining continence- I  Transferring/Ambulation- I  Managing Meds- I   Follow up appointments reviewed:  PCP Hospital f/u appt confirmed? No   Specialist Hospital f/u appt confirmed? No   Are transportation arrangements needed? No  If their condition worsens, is the pt aware to call PCP or go to the Emergency Dept.? Yes Was the patient provided with contact information for the PCP's office or ED? Yes Was to pt encouraged to call back with questions or concerns? Yes

## 2021-04-19 ENCOUNTER — Emergency Department (INDEPENDENT_AMBULATORY_CARE_PROVIDER_SITE_OTHER)
Admission: EM | Admit: 2021-04-19 | Discharge: 2021-04-19 | Disposition: A | Discharge status: Home / Self Care | Payer: Managed Care, Other (non HMO) | Source: Home / Self Care

## 2021-04-19 ENCOUNTER — Encounter: Payer: Self-pay | Admitting: Emergency Medicine

## 2021-04-19 DIAGNOSIS — H519 Unspecified disorder of binocular movement: Secondary | ICD-10-CM | POA: Diagnosis not present

## 2021-04-19 DIAGNOSIS — H5713 Ocular pain, bilateral: Secondary | ICD-10-CM

## 2021-04-19 NOTE — Discharge Instructions (Addendum)
Patient/sister advised to go to [facility] now for immediate evaluation of bilateral eye pain and eye movement normality.  Patient/sister (who will be driving) agreed and verbalized understanding of these instructions.

## 2021-04-19 NOTE — ED Triage Notes (Signed)
Patient states that she was at work, began having the motion to look upward.  Patient is unable to control the motion.  This happened back in April and never had a head scan done.

## 2021-04-19 NOTE — ED Provider Notes (Signed)
[facility]    CSN: [id number] Arrival date & time: 04/19/21  0959      History   Chief Complaint Chief Complaint  Patient presents with   Unable to look down, having to look upward    HPI Beth Holmes is a 20 y.o. female.   HPI 20 year old female presents with inability to control eye movement.  Patient reports roughly at 9 AM this morning she was at work and was unable to look down her eyes are only able to look upward with accompanying bilateral eye pain.  Patient reports similar symptoms in April of this year and was evaluated at [facility] ED and was told that she may have a possible concussion but never received scan of head.  Patient's sister drove her from work to our location and is here in the parking lot.  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 Authorizing Provider  DULoxetine (CYMBALTA) 60 MG capsule Take 1 capsule (60 mg total) by mouth daily. 02/19/21   Lesly Dukes, MD  ibuprofen (ADVIL,MOTRIN) 200 MG tablet Take 600 mg by mouth every 6 (six) hours as needed for mild pain.    [provider]  norethindrone  (AYGESTIN) 5 MG tablet Take 1 tablet by mouth daily 09/29/20     ondansetron (ZOFRAN) 4 MG tablet Take 1 tablet (4 mg total) by mouth every 8 (eight) hours as needed for nausea or vomiting. 01/20/21   Muthersbaugh, Dahlia Client, PA-C  pantoprazole (PROTONIX) 40 MG tablet TAKE 1 TABLET (40 MG TOTAL) BY MOUTH 2 (TWO) TIMES DAILY. TAKE 40MG  TWICE DAILY FOR 6 WEEKS, THEN REDUCE TO ONCE DAILY 08/11/20 08/11/21  Cirigliano, Vito V, DO  pantoprazole (PROTONIX) 40 MG tablet TAKE 1 TABLET (40 MG TOTAL) BY MOUTH DAILY. 06/15/20 06/15/21  08/15/21, NP  valACYclovir (VALTREX) 500 MG tablet TAKE 1 TABLET BY MOUTH ONCE DAILY 06/26/20 06/26/21  06/28/21, MD    Family History Family History  Problem Relation Age of Onset   Thyroid disease Father    Lung cancer Father    Throat cancer Father    Bone cancer Father    Diabetes Maternal Grandmother    Hypertension Maternal Grandmother    Diabetes Maternal Grandfather    Hypertension Maternal  Grandfather    Hypertension Paternal Grandmother    Colon cancer Paternal Grandmother    Hypertension Paternal Grandfather    Ovarian cancer Paternal Aunt    Crohn's disease Neg Hx    Inflammatory bowel disease Neg Hx    Food intolerance Neg Hx    Celiac disease Neg Hx    GER disease Neg Hx    Migraines Neg Hx    Esophageal cancer Neg Hx    Stomach cancer Neg Hx    Rectal cancer Neg Hx     Social History Social History   Tobacco Use   Smoking status: Never   Smokeless tobacco: Never  Vaping Use   Vaping Use: Never used  Substance Use Topics   Alcohol use: Never   Drug use: Never     Allergies   Patient has no known allergies.   Review of Systems Review of Systems  Eyes:  Positive for pain and visual disturbance.       Patient is unable to control eye movements and defaults to looking straight up since 9 AM    Physical Exam Triage Vital Signs ED Triage Vitals [04/19/21 1016]  Enc Vitals Group     BP 126/85     Pulse Rate (!) 103     Resp       Temp 98.5 F (36.9 C)     Temp Source Oral     SpO2 100 %     Weight      Height      Head Circumference      Peak Flow      Pain Score 0     Pain Loc      Pain Edu?      Excl. in GC?    No data found.  Updated Vital Signs BP 126/85 (BP Location: Left Arm)   Pulse (!) 103   Temp 98.5 F (36.9 C) (Oral)   SpO2 100%   Physical Exam Vitals and nursing note reviewed.  Constitutional:      General: She is in acute distress.     Appearance: Normal appearance. She is normal weight. She is ill-appearing.  HENT:     Head: Normocephalic and atraumatic.     Mouth/Throat:     Mouth: Mucous membranes are moist.     Pharynx: Oropharynx is clear.  Eyes:     Conjunctiva/sclera: Conjunctivae normal.     Pupils: Pupils are equal, round, and reactive to light.     Comments: Bilateral eyes Default to looking up patient can only follow instruction on eye movement for 1 to 2 seconds and then bilateral eyes move anteriorly posteriorly, EOM not intact, considerable tearing noted-patient reports bilateral eye pain during exam  Cardiovascular:     Rate and Rhythm: Normal rate and regular rhythm.     Pulses: Normal pulses.     Heart sounds: Normal heart sounds.  Pulmonary:     Effort: Pulmonary effort is normal. No respiratory distress.     Breath sounds: Normal breath sounds. No wheezing, rhonchi or rales.  Musculoskeletal:        General: Normal range of motion.     Cervical back: Normal range of motion and neck supple. No tenderness.  Lymphadenopathy:     Cervical: No cervical adenopathy.  Skin:    General: Skin is warm and dry.  Neurological:     General: No focal deficit present.     Mental Status: She is alert and oriented to person, place,  and time.  Psychiatric:        Mood and Affect: Mood normal.        Behavior: Behavior normal.     UC Treatments / Results  Labs (all labs ordered are listed, but only abnormal results are displayed) Labs Reviewed - No data to  display  EKG   Radiology No results found.  Procedures Procedures (including critical care time)  Medications Ordered in UC Medications - No data to display  Initial Impression / Assessment and Plan / UC Course  I have reviewed the triage vital signs and the nursing notes.  Pertinent labs & imaging results that were available during my care of the patient were reviewed by me and considered in my medical decision making (see chart for details).     MDM: 1. Eye pain bilaterally, 2.  Eye movement abnormality.  Discharged, hemodynamically stable and instructed go to [facility] now for immediate evaluation of bilateral eye pain and eye movement abnormality.  Sister is present and will be driving. Final Clinical Impressions(s) / UC Diagnoses   Final diagnoses:  Eye pain, bilateral  Eye movement abnormality     Discharge Instructions      Patient/sister advised to go to [facility] now for immediate evaluation of bilateral eye pain and eye movement normality.  Patient/sister (who will be driving) agreed and verbalized understanding of these instructions.     ED Prescriptions   None    PDMP not reviewed this encounter.   Trevor Iha, FNP 04/19/21 1134

## 2021-04-20 ENCOUNTER — Telehealth: Payer: Self-pay

## 2021-04-20 NOTE — Telephone Encounter (Signed)
Transition Care Management Follow-up Telephone Call Date of discharge and from where: 04/19/2021-[facility] ED How have you been since you were released from the hospital? Ok,still have eye and stomach pain. Any questions or concerns? No  Items Reviewed: Did the pt receive and understand the discharge instructions provided? Yes  Medications obtained and verified? No  Other? No  Any new allergies since your discharge? No  Dietary orders reviewed? No Do you have support at home? Yes   Home Care and Equipment/Supplies: Were home health services ordered? not applicable If so, what is the name of the agency? N/A  Has the agency set up a time to come to the patient's home? not applicable Were any new equipment or medical supplies ordered?  No What is the name of the medical supply agency? N/A Were you able to get the supplies/equipment? not applicable Do you have any questions related to the use of the equipment or supplies? No  Functional Questionnaire: (I = Independent and D = Dependent) ADLs: I  Bathing/Dressing- I  Meal Prep- I  Eating- I  Maintaining continence- I  Transferring/Ambulation- I  Managing Meds- I  Follow up appointments reviewed:  PCP Hospital f/u appt confirmed? No  Patient will call PCP today to schedule appointment.  Specialist Hospital f/u appt confirmed? No   Are transportation arrangements needed? No  If their condition worsens, is the pt aware to call PCP or go to the Emergency Dept.? Yes Was the patient provided with contact information for the PCP's office or ED? Yes Was to pt encouraged to call back with questions or concerns? Yes

## 2021-05-11 ENCOUNTER — Other Ambulatory Visit: Payer: Self-pay

## 2021-05-11 ENCOUNTER — Ambulatory Visit (INDEPENDENT_AMBULATORY_CARE_PROVIDER_SITE_OTHER): Payer: Managed Care, Other (non HMO) | Admitting: Family Medicine

## 2021-05-11 ENCOUNTER — Encounter: Payer: Self-pay | Admitting: Family Medicine

## 2021-05-11 VITALS — BP 121/79 | HR 89 | Temp 97.9°F | Resp 17 | Wt 221.9 lb

## 2021-05-11 DIAGNOSIS — Z0000 Encounter for general adult medical examination without abnormal findings: Secondary | ICD-10-CM | POA: Diagnosis not present

## 2021-05-11 DIAGNOSIS — E871 Hypo-osmolality and hyponatremia: Secondary | ICD-10-CM

## 2021-05-11 DIAGNOSIS — Z13220 Encounter for screening for lipoid disorders: Secondary | ICD-10-CM | POA: Diagnosis not present

## 2021-05-11 LAB — COMPLETE METABOLIC PANEL WITH GFR
AG Ratio: 1.4 (calc) (ref 1.0–2.5)
ALT: 25 U/L (ref 5–32)
AST: 14 U/L (ref 12–32)
Albumin: 3.9 g/dL (ref 3.6–5.1)
Alkaline phosphatase (APISO): 79 U/L (ref 36–128)
BUN: 14 mg/dL (ref 7–20)
CO2: 25 mmol/L (ref 20–32)
Calcium: 9 mg/dL (ref 8.9–10.4)
Chloride: 103 mmol/L (ref 98–110)
Creat: 0.61 mg/dL (ref 0.50–0.96)
Globulin: 2.7 g/dL (calc) (ref 2.0–3.8)
Glucose, Bld: 84 mg/dL (ref 65–99)
Potassium: 4.2 mmol/L (ref 3.8–5.1)
Sodium: 138 mmol/L (ref 135–146)
Total Bilirubin: 0.4 mg/dL (ref 0.2–1.1)
Total Protein: 6.6 g/dL (ref 6.3–8.2)
eGFR: 133 mL/min/{1.73_m2} (ref >=60)

## 2021-05-11 LAB — LIPID PANEL
Cholesterol: 196 mg/dL — ABNORMAL HIGH (ref <170)
HDL: 43 mg/dL — ABNORMAL LOW (ref >45)
LDL Cholesterol (Calc): 134 mg/dL (calc) — ABNORMAL HIGH (ref <110)
Non-HDL Cholesterol (Calc): 153 mg/dL (calc) — ABNORMAL HIGH (ref <120)
Total CHOL/HDL Ratio: 4.6 (calc) (ref <5.0)
Triglycerides: 91 mg/dL — ABNORMAL HIGH (ref <90)

## 2021-05-11 LAB — CBC
HCT: 42.6 % (ref 34.0–46.0)
Hemoglobin: 13.8 g/dL (ref 11.5–15.3)
MCH: 25 pg (ref 25.0–35.0)
MCHC: 32.4 g/dL (ref 31.0–36.0)
MCV: 77 fL — ABNORMAL LOW (ref 78.0–98.0)
MPV: 9.8 fL (ref 7.5–12.5)
Platelets: 333 10*3/uL (ref 140–400)
RBC: 5.53 10*6/uL — ABNORMAL HIGH (ref 3.80–5.10)
RDW: 13.3 % (ref 11.0–15.0)
WBC: 9.3 10*3/uL (ref 4.5–13.0)

## 2021-05-11 NOTE — Progress Notes (Signed)
ADOLESCENT WELL-VISIT  HPI: Beth Holmes is a 20 y.o. female who presents to [facility]  today for well-child check. Preventive care reviewed in Assessment/Plan, see below.   Just graduated high school this year. Planning to start online college this fall thru [employer] for early childhood development. 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 Currently working at [employer] over the summer.   Past medical, social and family history reviewed: Past Medical History:  Diagnosis Date   Anxiety    Asthma    Chronic pelvic pain in female    Depression    Ovarian cyst    rt side   Past Surgical History:  Procedure Laterality Date   ADENOIDECTOMY     OVARIAN CYST SURGERY     TONSILLECTOMY     UPPER GASTROINTESTINAL ENDOSCOPY  08/11/2020   Social History   Tobacco Use   Smoking status: Never   Smokeless tobacco: Never  Substance Use Topics   Alcohol use: Never   Family History  Problem Relation Age of Onset   Thyroid disease Father    Lung cancer Father    Throat cancer Father    Bone cancer Father    Diabetes Maternal Grandmother    Hypertension Maternal Grandmother    Diabetes Maternal Grandfather    Hypertension Maternal Grandfather    Hypertension Paternal Grandmother    Colon cancer Paternal Grandmother    Hypertension Paternal Grandfather    Ovarian cancer Paternal Aunt    Crohn's disease Neg Hx    Inflammatory bowel disease Neg Hx    Food intolerance Neg Hx    Celiac disease Neg Hx    GER disease Neg Hx    Migraines Neg Hx    Esophageal cancer Neg Hx    Stomach cancer Neg Hx    Rectal cancer Neg Hx     Current Outpatient Medications  Medication Sig Dispense Refill   ibuprofen (ADVIL,MOTRIN) 200 MG tablet Take 600 mg by mouth every 6 (six) hours as needed for mild pain.     ondansetron (ZOFRAN) 4 MG tablet Take 1 tablet (4 mg total) by mouth every 8 (eight) hours as needed for  nausea or vomiting. 10 tablet 0   pantoprazole (PROTONIX) 40 MG tablet TAKE 1 TABLET (40 MG TOTAL) BY MOUTH DAILY. 30 tablet 3   valACYclovir (VALTREX) 500 MG tablet TAKE 1 TABLET BY MOUTH ONCE DAILY 30 tablet 12   No current facility-administered medications for this visit.   No Known Allergies  Review of Systems: CONSTITUTIONAL: Neg fever/chills, no unintentional weight changes HEAD/EYES/EARS/NOSE: No headache/vision change or hearing change CARDIAC: No chest pain/pressure/palpitations, no orthopnea RESPIRATORY: No cough/shortness of breath/wheeze GASTROINTESTINAL: No nausea/vomiting/blood in stool/diarrhea/constipation; intermittent abdominal discomfort and bloating after laparoscopic procedure MUSCULOSKELETAL: No myalgia/arthralgia GENITOURINARY: No incontinence, No abnormal genital bleeding/discharge SKIN: No rash/wounds/concerning lesions HEM/ONC: No easy bruising/bleeding, no abnormal lymph node PSYCHIATRIC: No concerns with depression/anxiety or sleep problems    Exam:  BP 121/79   Pulse 89   Temp 97.9 F (36.6 C)   Resp 17   Wt 221 lb 14.4 oz (100.7 kg)   SpO2 97%   BMI 39.31 kg/m  Growth curve reviewed:  Constitutional: VSS, see above. General Appearance: alert, well-developed, well-nourished, NAD Eyes: Normal lids and conjunctive, non-icteric sclera, PERRLA Ears, Nose, Mouth, Throat: Normal external inspection ears/nares/mouth/lips/gums, Normal TM bilaterally, MMM, posterior pharynx without erythema/exudate Neck: No masses, trachea midline. No thyroid  enlargement/tenderness/mass appreciated Respiratory: Normal respiratory effort. No dullness/hyper-resonance to percussion. Breath sounds normal, no wheeze/rhonchi/rales Cardiovascular: S1/S2 normal, no murmur/rub/gallop auscultated. No carotid bruit or JVD. No abdominal aortic bruit. Pedal pulse II/IV bilaterally DP and PT. No lower extremity edema. Gastrointestinal: Nontender, no masses. No hepatomegaly, no  splenomegaly. No hernia appreciated. Rectal exam deferred.  Musculoskeletal: Gait normal. No clubbing/cyanosis of digits.  Skin: No acanthosis nigricans, atypical nevi, tattoo/piercing, signs of abuse or self-inflicted injury Neurological: No cranial nerve deficit on limited exam. Motor and sensation intact and symmetric Psychiatric: Normal judgment/insight. Normal mood and affect. Oriented x3.    No results found for this or any previous visit (from the past 72 hour(s)). No results found.  Flowsheet Row Office Visit from 05/11/2021 in [facility]  PHQ-9 Total Score 13      GAD 7 : Generalized Anxiety Score 05/11/2021 06/15/2020 05/08/2020  Nervous, Anxious, on Edge 3 2 3   Control/stop worrying 3 3 3   Worry too much - different things 3 2 3   Trouble relaxing 1 1 2   Restless 2 1 1   Easily annoyed or irritable 2 1 2   Afraid - awful might happen 2 1 1   Total GAD 7 Score 16 11 15   Anxiety Difficulty Somewhat difficult Somewhat difficult Not difficult at all       ASSESSMENT/PLAN:  Preventative health care - Plan: CBC, COMPLETE METABOLIC PANEL WITH GFR, Lipid panel  Hyponatremia - Plan: COMPLETE METABOLIC PANEL WITH GFR  Lipid screening - Plan: Lipid panel    Recent laparoscopic surgery for endometriosis. Reports some mild abdominal discomfort and bloating. No guarding or palpable masses - she is trying to get in contact with the office that did her procedure for a follow-up. Patient aware of signs/symptoms requiring further/urgent evaluation. Routine labs ordered today. Elevated PHQ9/GAD7 - she is interested in switching her regimen and is considering counseling, but prefers to see her PCP next week to further discuss this. No suicidal/homicidal ideation.    PREVENTIVE CARE ADOLESCENT:  HPV:  completed INFLUENZA ANNUALLY:  defer to flu season  ROUTINE SCREENING DENTIST 2X/YR, BRUSH TEETH 2X/DAY: No: states she need to get in with a  dentist PHYSICAL ACTIVITY 60+ MIN/DAY DISCUSSED SCREEN TIME <2 HR/DAY DISCUSSED FAMILY TIME IMPORTANCE DISCUSSED SCHOOL WORK IMPORTANCE DISCUSSED EXTRACURRICULAR ACTIVITIES: No: spends time with family, friends, boyfriend, babysitting STRESS/COPING DISCUSSED TRUSTED ADULT: mom PGQ9: Positive would like to wait and come back next week to discuss with PCP  PUBERTY CONCERNS ANY QUESTIONS? No:  IF FEMALE - PERIOD ONSET: periods started around age 3; IUD in place now SEXUAL ACTIVITY: vaginal with one female 6 IN: female  GENDER IDENTITY: female PREGNANCY PREVENTION:DISCUSSED SAFE SEX No concern STI PREVENTION: DISCUSSED SAFE SEX No concern UNDERSTANDING OF CONSENT: NO MEANS NO, ACTIVE CONSENT NEEDED No concern   SAFETY - SEAT BELTS: Yes HELMET: Yes KNOW HOW TO SWIM: Yes KNOW DON'T RIDE WITH ANYONE YOU DON'T TRUST: Yes KNOW DON'T RIDE WITH ANYONE WHO HAS USED ALCOHOL/DRUGS: Yes GUNS IN HOME: No:   UNDERSTANDS NONVIOLENCE IN CONFLICT RESOLUTION: Yes     AS NEEDED/AT RISK -   CBC/CMP - abnormal earlier this month, rechecking today LIPIDS:  age 68+ BMI >94th%ile? Yes  Family history of HTN, HLD, DM, CKD  STI: No concern; negative earlier this month PREGNANCY: No concern; negative earlier this month ALCOHOL/DRUG USE: denies    Follow-up with PCP next week to discuss anxiety/depression   B. , DNP, FNP-C

## 2021-05-13 NOTE — Progress Notes (Signed)
Blood counts and kidney/liver function and electrolytes are fine and staying around your baseline! Your cholesterol is higher than we would like it to be. Let's try a good 3-4 months of lifestyle management to see if we can get this back to normal: exercise for at least 30 minutes 5 times per week and eat a healthy diet avoiding saturated fats. Please schedule an appointment with Joy to recheck your levels in 3-4 months. Let us know if you have any questions.

## 2021-05-16 ENCOUNTER — Ambulatory Visit: Payer: Managed Care, Other (non HMO) | Admitting: Medical-Surgical

## 2021-05-16 DIAGNOSIS — F419 Anxiety disorder, unspecified: Secondary | ICD-10-CM

## 2021-05-16 NOTE — Progress Notes (Deleted)
  HPI with pertinent ROS:   CC: mood follow up  HPI: Tried cymbalta and nortriptyline in the past  Medication: Side effects: Sleep quality: Appetite: Weight changes: Counseling: SI/HI:  I reviewed the past medical history, family history, social history, surgical history, and allergies today and no changes were needed.  Please see the problem list section below in epic for further details.   Physical exam:   Physical Exam  Impression and Recommendations:    1. Anxiety ***   No follow-ups on file. ___________________________________________ [name], DNP, APRN, FNP-BC Primary Care and Sports Medicine [facility] [place]

## 2021-06-25 IMAGING — US US ABDOMEN LIMITED
1 series · 14 of 25 positions shown · non-contrast
Comparison: 09/18/2018

CLINICAL DATA: Nausea vomiting and epigastric pain

EXAM:
ULTRASOUND ABDOMEN LIMITED RIGHT UPPER QUADRANT

[Series 1: us abdomen limited · 0.09mm/px · 14 of 72 slices shown]
[im 1/72]
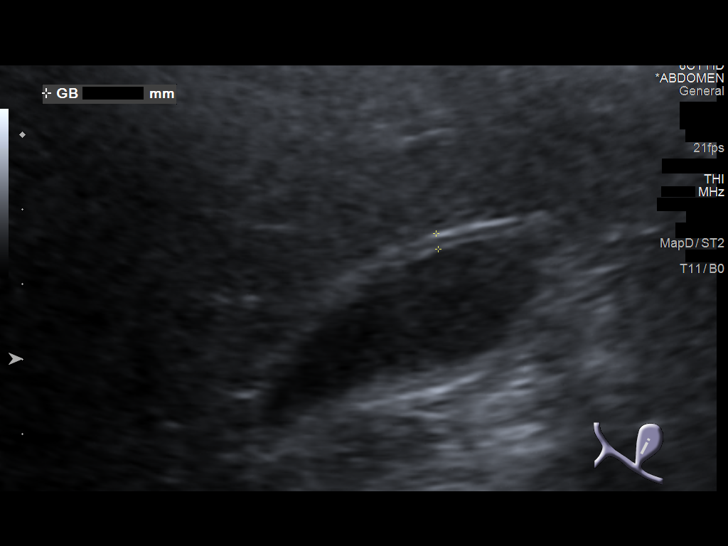
[im 6/72]
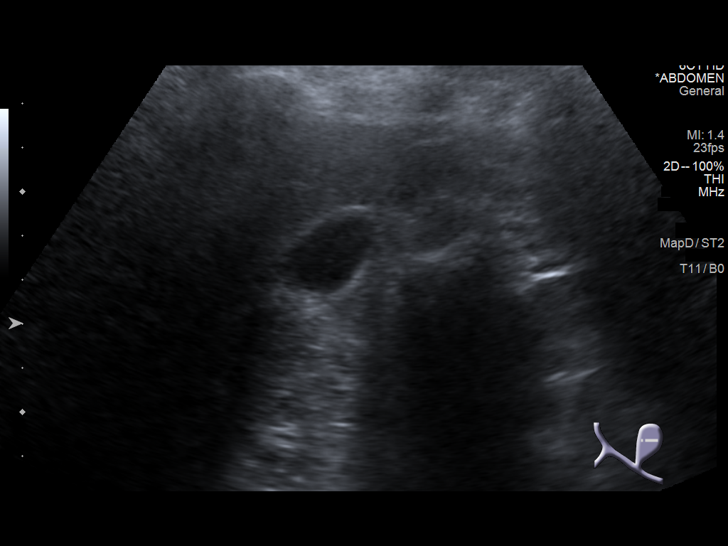
[im 12/72]
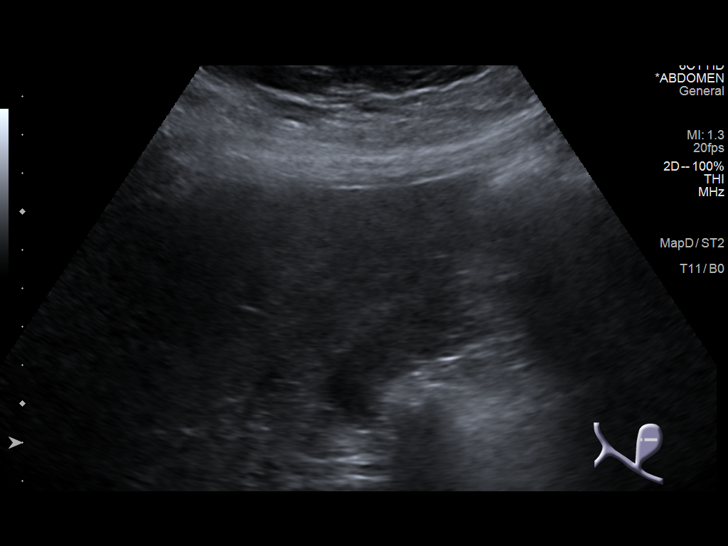
[im 18/72]
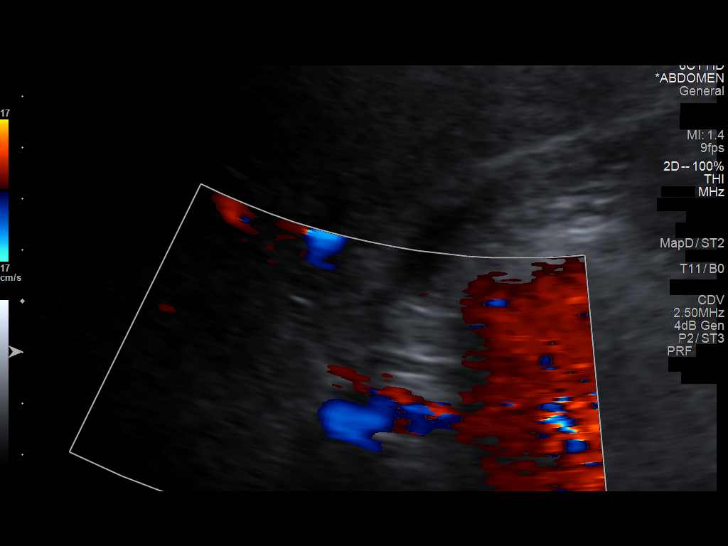
[im 24/72]
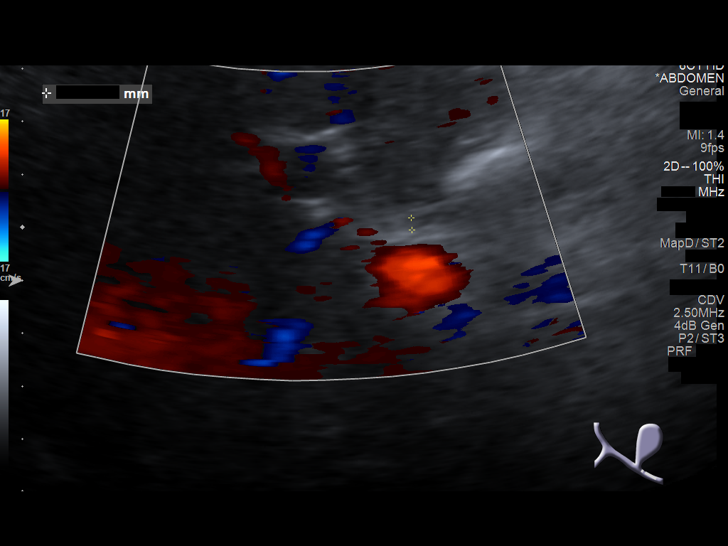
[im 27/72]
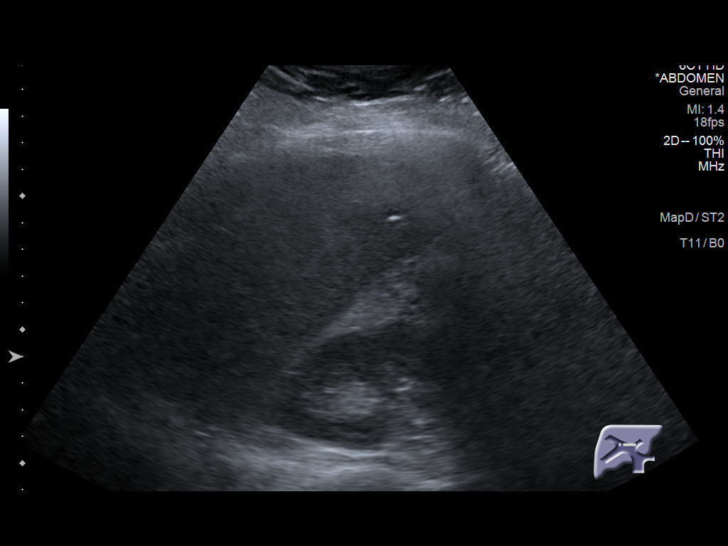
[im 33/72]
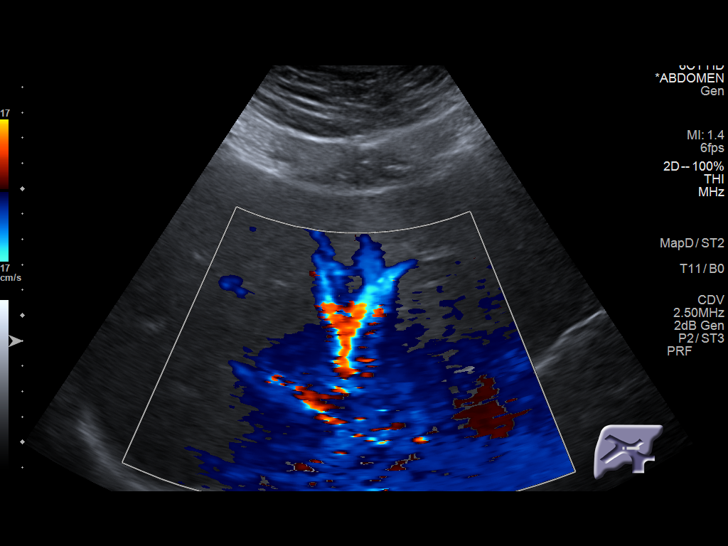
[im 39/72]
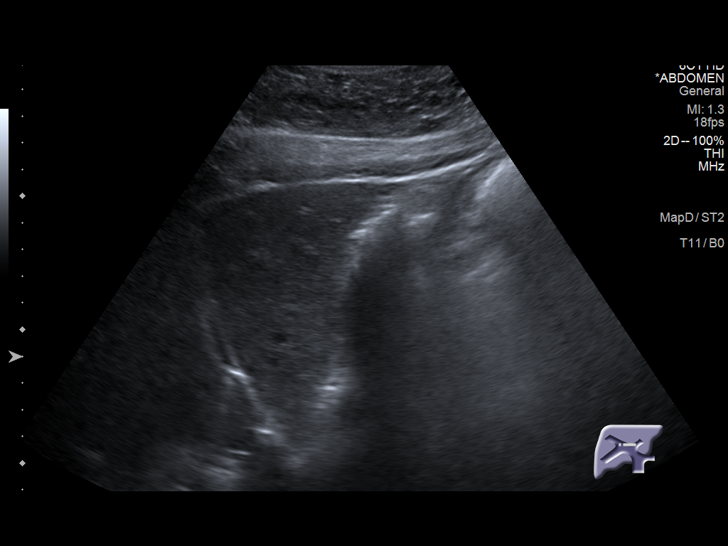
[im 45/72]
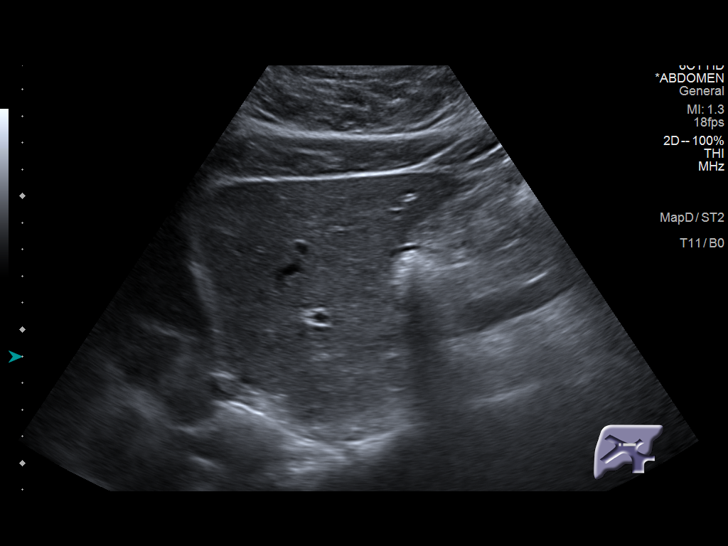
[im 48/72]
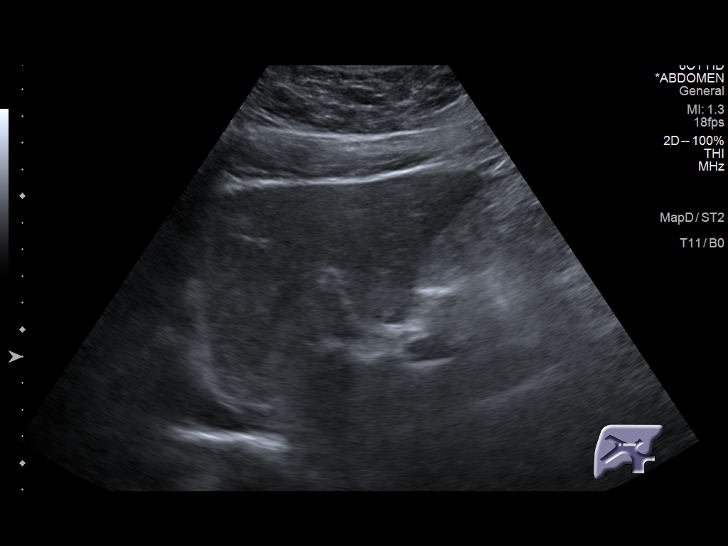
[im 54/72]
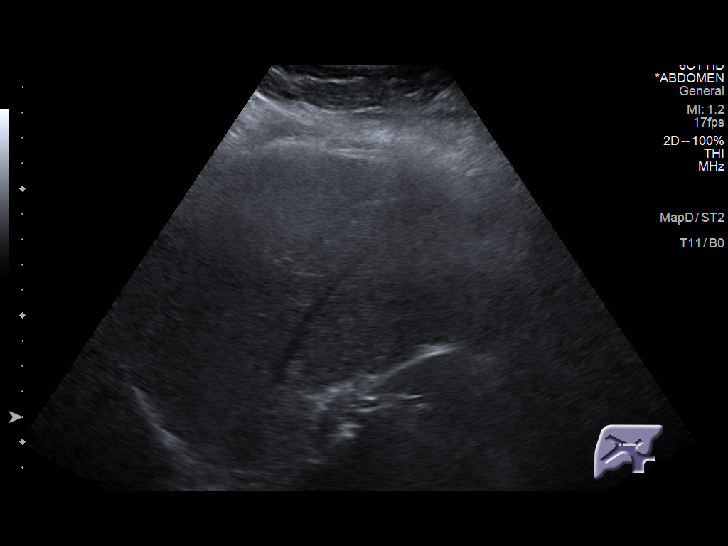
[im 60/72]
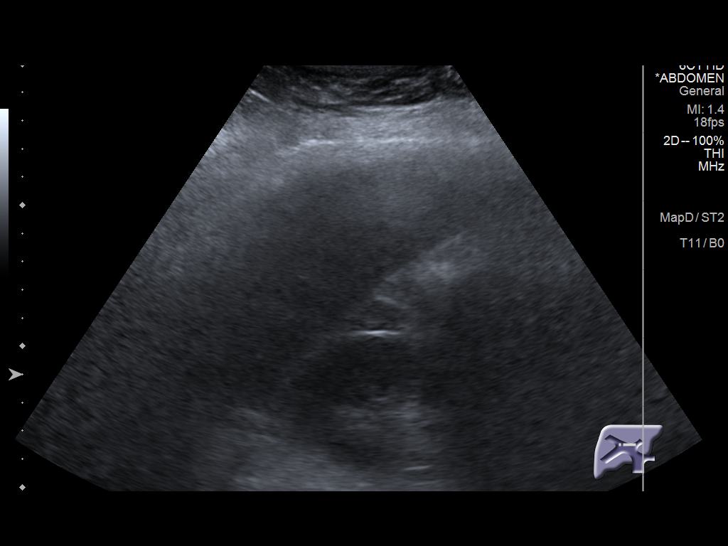
[im 66/72]
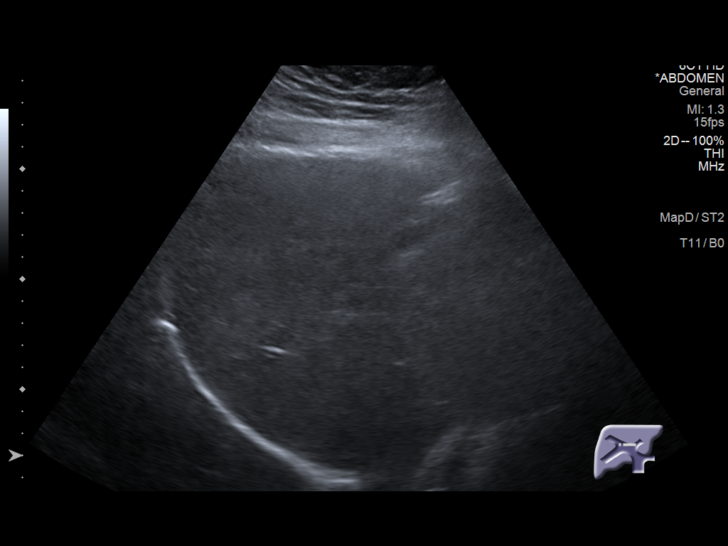
[im 72/72]
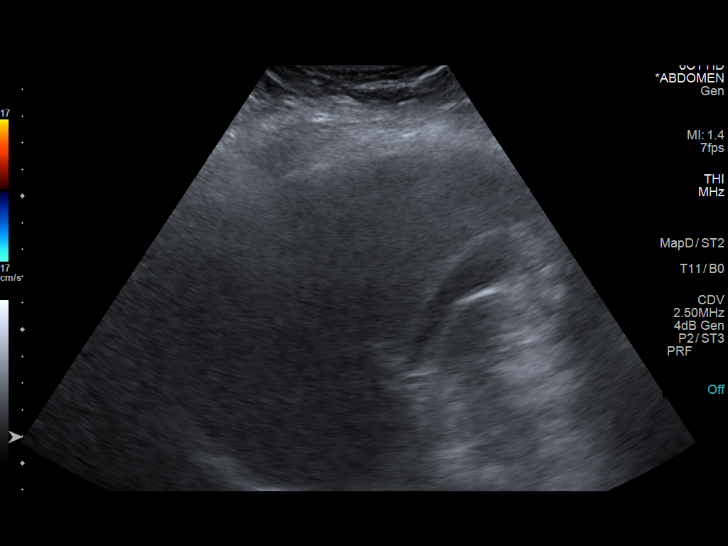

[14 of 25 positions shown; findings below may reference images not displayed]

FINDINGS: Gallbladder:

Partially distended. No gallstones are seen. No pericholecystic
fluid is noted.

Common bile duct:

Diameter: 3.7 mm.

Liver:

No focal lesion identified. Within normal limits in parenchymal
echogenicity. Portal vein is patent on color Doppler imaging with
normal direction of blood flow towards the liver.

Other: None.
IMPRESSION: No acute abnormality noted.

## 2021-06-29 ENCOUNTER — Encounter: Payer: Self-pay | Admitting: Medical-Surgical

## 2021-06-29 ENCOUNTER — Other Ambulatory Visit: Payer: Self-pay

## 2021-06-29 ENCOUNTER — Other Ambulatory Visit (HOSPITAL_BASED_OUTPATIENT_CLINIC_OR_DEPARTMENT_OTHER): Payer: Self-pay

## 2021-06-29 ENCOUNTER — Ambulatory Visit (INDEPENDENT_AMBULATORY_CARE_PROVIDER_SITE_OTHER): Payer: Managed Care, Other (non HMO) | Admitting: Medical-Surgical

## 2021-06-29 VITALS — BP 113/80 | HR 79 | Resp 20 | Ht 63.01 in | Wt 237.0 lb

## 2021-06-29 DIAGNOSIS — F419 Anxiety disorder, unspecified: Secondary | ICD-10-CM | POA: Diagnosis not present

## 2021-06-29 DIAGNOSIS — G4452 New daily persistent headache (NDPH): Secondary | ICD-10-CM | POA: Diagnosis not present

## 2021-06-29 MED ORDER — VENLAFAXINE HCL ER 37.5 MG PO CP24
37.5000 mg | ORAL_CAPSULE | Freq: Every day | ORAL | 1 refills | Status: DC
  Filled 2021-06-29: qty 30, 30d supply, fill #0

## 2021-06-29 MED ORDER — RIZATRIPTAN BENZOATE 5 MG PO TBDP
5.0000 mg | ORAL_TABLET | ORAL | 0 refills | Status: DC | PRN
  Filled 2021-06-29: qty 10, 5d supply, fill #0

## 2021-06-29 NOTE — Progress Notes (Signed)
  HPI with pertinent ROS:   CC: Headaches, anxiety  HPI: Very pleasant 20 year old female presenting today for the following:  Headaches-has had a history of chronic headaches but over the last 2 weeks, her headaches have been daily.  They are accompanied by vision changes, eye pain, tearing, nausea, vomiting, photophobia, phonophobia, and pressure/pain in the center of her forehead and directly on the crown of her head.  She takes 800 mg of ibuprofen or 650 mg of Tylenol on a as needed basis.  She did try a generic headache relief medication at 1 point.  Notes that the only thing that helps is to take a medication and then go to sleep.  She has not tried a prescription abortive medication or a preventative 1.  She was taking Cymbalta for approximately a year but about 1 to 2 months ago, she had an episode that landed her in the hospital after they thought she had a medication reaction.  They initially thought she was having a mini seizure but eventually decided it was medication related.  They stopped her Cymbalta cold [place] and she has had a resurgence of her symptoms.  Her headaches started to worsen shortly after that.  Anxiety-after stopping her Cymbalta, she had a significant resurgence in anxiety symptoms.  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  Has not tried any other medications for management of anxiety but is very interested in getting started on something.  I reviewed the past medical history, family history, social history, surgical history, and allergies today and no changes were needed.  Please see the problem list section below in epic for further details.  Physical exam:   General: Well Developed, well nourished, and in no acute distress.  Neuro: Alert and oriented x3.  HEENT: Normocephalic, atraumatic.  Skin: Warm and dry, no rashes. Cardiac: Regular rate and rhythm, no murmurs rubs or gallops, no lower  extremity edema.  Respiratory: Clear to auscultation bilaterally. Not using accessory muscles, speaking in full sentences.  Impression and Recommendations:    1. New daily persistent headache This could be related to the cessation of Cymbalta after long-term treatment.  Her symptoms do present like migraines so adding Maxalt 5 mg daily as needed see if this is helpful.  Referring to neurology for further evaluation as her seizure-like symptoms 1 to 2 months ago are very concerning in the setting of worsening headaches.  2. Anxiety Discussed various options.  She did very well with Cymbalta so would like to stay in the SNRI class.  Starting Effexor 37.5 mg daily.  We will take this as a slow titration so we will follow-up in 4 weeks prior to making any dose changes.  Return in about 4 weeks (around 07/27/2021) for mood follow up. ___________________________________________ [name], DNP, APRN, FNP-BC Primary Care and Sports Medicine [facility] [place]

## 2021-07-03 ENCOUNTER — Other Ambulatory Visit (HOSPITAL_BASED_OUTPATIENT_CLINIC_OR_DEPARTMENT_OTHER): Payer: Self-pay

## 2021-07-03 MED ORDER — AZITHROMYCIN 250 MG PO TABS
ORAL_TABLET | ORAL | 0 refills | Status: DC
  Filled 2021-07-03: qty 6, 5d supply, fill #0

## 2021-07-03 MED ORDER — LIDOCAINE VISCOUS HCL 2 % MT SOLN
OROMUCOSAL | 0 refills | Status: DC
  Filled 2021-07-03: qty 300, 5d supply, fill #0

## 2021-07-03 MED ORDER — BENZONATATE 100 MG PO CAPS
ORAL_CAPSULE | ORAL | 0 refills | Status: DC
  Filled 2021-07-03: qty 21, 7d supply, fill #0

## 2021-07-10 ENCOUNTER — Encounter: Payer: Self-pay | Admitting: Medical-Surgical

## 2021-07-13 ENCOUNTER — Encounter: Payer: Self-pay | Admitting: Family Medicine

## 2021-07-13 ENCOUNTER — Other Ambulatory Visit: Payer: Self-pay

## 2021-07-13 ENCOUNTER — Ambulatory Visit (INDEPENDENT_AMBULATORY_CARE_PROVIDER_SITE_OTHER): Payer: Managed Care, Other (non HMO) | Admitting: Family Medicine

## 2021-07-13 VITALS — BP 102/69 | HR 72 | Wt 237.1 lb

## 2021-07-13 DIAGNOSIS — B079 Viral wart, unspecified: Secondary | ICD-10-CM

## 2021-07-13 DIAGNOSIS — L918 Other hypertrophic disorders of the skin: Secondary | ICD-10-CM

## 2021-07-13 NOTE — Progress Notes (Signed)
Acute Office Visit  Subjective:    Patient ID: Beth Holmes, female    DOB: 2001/02/12, 20 y.o.   MRN: [id number]  Chief Complaint  Patient presents with   Verrucous Vulgaris    HPI Patient is in today for skin tag and wart removal.  Warts: R elbow (1 month), L forearm (1 week), R hand (2 months) Keeps bumping them into things and they are irritating for her  Skin tags: Several small one on neck - just recently popped up, irritating when clothing/jewelry gets caught on them, cosmetic concern One on L upper leg at panty line   Past Medical History:  Diagnosis Date   Anxiety    Asthma    Chronic pelvic pain in female    Depression    Ovarian cyst    rt side    Past Surgical History:  Procedure Laterality Date   ADENOIDECTOMY     OVARIAN CYST SURGERY     TONSILLECTOMY     UPPER GASTROINTESTINAL ENDOSCOPY  08/11/2020    Family History  Problem Relation Age of Onset   Thyroid disease Father    Lung cancer Father    Throat cancer Father    Bone cancer Father    Diabetes Maternal Grandmother    Hypertension Maternal Grandmother    Diabetes Maternal Grandfather    Hypertension Maternal Grandfather    Hypertension Paternal Grandmother    Colon cancer Paternal Grandmother    Hypertension Paternal Grandfather    Ovarian cancer Paternal Aunt    Crohn's disease Neg Hx    Inflammatory bowel disease Neg Hx    Food intolerance Neg Hx    Celiac disease Neg Hx    GER disease Neg Hx    Migraines Neg Hx    Esophageal cancer Neg Hx    Stomach cancer Neg Hx    Rectal cancer Neg Hx     Social History   Socioeconomic History   Marital status: Single    Spouse name: Not on file   Number of children: Not on file   Years of education: Not on file   Highest education level: Not on file  Occupational History   Not on file  Tobacco Use   Smoking status: Never   Smokeless tobacco: Never  Vaping Use   Vaping Use: Never used  Substance and Sexual Activity    Alcohol use: Never   Drug use: Never   Sexual activity: Yes    Birth control/protection: None  Other Topics Concern   Not on file  Social History Narrative   10th grade at [employer]- Lives with Parents and sister   Social Determinants of Health   Financial Resource Strain: Not on file  Food Insecurity: Not on file  Transportation Needs: Not on file  Physical Activity: Not on file  Stress: Not on file  Social Connections: Not on file  Intimate Partner Violence: Not on file    Outpatient Medications Prior to Visit  Medication Sig Dispense Refill   benzonatate (TESSALON) 100 MG capsule Take 1 capsule by mouth 3 times a day as needed for cough 21 capsule 0   ibuprofen (ADVIL,MOTRIN) 200 MG tablet Take 600 mg by mouth every 6 (six) hours as needed for mild pain.     lidocaine-alum & mag hydroxide-simeth-diphenhydrAMINE Gargle with 5-11m every 4-6 hours daily as needed. 300 mL 0   ondansetron (ZOFRAN) 4 MG tablet Take 1 tablet (4 mg total) by mouth every 8 (eight) hours as needed  for nausea or vomiting. 10 tablet 0   rizatriptan (MAXALT-MLT) 5 MG disintegrating tablet Take 1 tablet (5 mg total) by mouth as needed for migraine. May repeat in 2 hours if needed 10 tablet 0   venlafaxine XR (EFFEXOR XR) 37.5 MG 24 hr capsule Take 1 capsule (37.5 mg total) by mouth daily with breakfast. 30 capsule 1   azithromycin (ZITHROMAX) 250 MG tablet Take 2 tablets by mouth today then take 1 tablet everyday for 4 days (Patient not taking: Reported on 07/13/2021) 6 tablet 0   No facility-administered medications prior to visit.    No Known Allergies  Review of Systems All review of systems negative except what is listed in the HPI     Objective:    Physical Exam Vitals reviewed.  Constitutional:      Appearance: Normal appearance.  Musculoskeletal:     Cervical back: Normal range of motion and neck supple.  Skin:    Findings: No erythema or rash.     Comments: 5-6 small skin tags to R neck, 1 to  left neck, 1 to left upper/inner thigh, warts to bilateral forearm and right hand no erythema, edema, rashes, open lesions  Neurological:     Mental Status: She is alert and oriented to person, place, and time.  Psychiatric:        Behavior: Behavior normal.        Thought Content: Thought content normal.        Judgment: Judgment normal.    BP (!) 88/64 (BP Location: Left Arm, Patient Position: Sitting, Cuff Size: Large)   Pulse 94   Wt 237 lb 1.9 oz (107.6 kg)   BMI 41.99 kg/m  Wt Readings from Last 3 Encounters:  07/13/21 237 lb 1.9 oz (107.6 kg) (>99 %, Z= 2.37)*  06/29/21 237 lb (107.5 kg) (>99 %, Z= 2.37)*  05/11/21 221 lb 14.4 oz (100.7 kg) (99 %, Z= 2.22)*   * Growth percentiles are based on CDC (Girls, 2-20 Years) data.    Health Maintenance Due  Topic Date Due   Hepatitis C Screening  Never done    There are no preventive care reminders to display for this patient.   Lab Results  Component Value Date   TSH 1.17 02/21/2020   Lab Results  Component Value Date   WBC 9.3 05/11/2021   HGB 13.8 05/11/2021   HCT 42.6 05/11/2021   MCV 77.0 (L) 05/11/2021   PLT 333 05/11/2021   Lab Results  Component Value Date   NA 138 05/11/2021   K 4.2 05/11/2021   CO2 25 05/11/2021   GLUCOSE 84 05/11/2021   BUN 14 05/11/2021   CREATININE 0.61 05/11/2021   BILITOT 0.4 05/11/2021   ALKPHOS 69 01/19/2021   AST 14 05/11/2021   ALT 25 05/11/2021   PROT 6.6 05/11/2021   ALBUMIN 3.6 01/19/2021   CALCIUM 9.0 05/11/2021   ANIONGAP 9 01/19/2021   EGFR 133 05/11/2021   Lab Results  Component Value Date   CHOL 196 (H) 05/11/2021   Lab Results  Component Value Date   HDL 43 (L) 05/11/2021   Lab Results  Component Value Date   LDLCALC 134 (H) 05/11/2021   Lab Results  Component Value Date   TRIG 91 (H) 05/11/2021   Lab Results  Component Value Date   CHOLHDL 4.6 05/11/2021   Lab Results  Component Value Date   HGBA1C 5.3 02/21/2020       Assessment and  Plan  1.  Skin tag Procedure: Skin tag removal Informed consent:  Discussed risks (permanent scarring, infection, pain, bleeding, bruising, redness, and recurrence of the lesion) and benefits of the procedure, as well as the alternatives.  She is aware that skin tags are benign lesions, and their removal is often not considered medically necessary.  Informed consent was obtained. Anesthesia: numbing spray The area was prepared and draped in a standard fashion. Snip removal was performed.   Antibiotic ointment and a sterile dressing were applied.   The patient tolerated procedure well. The patient was instructed on post-op care.   Number of lesions removed:  7   2. Viral warts, unspecified type Cryotherapy template Procedure: Cryodestruction of: warts x3 Consent obtained and verified. Time-out conducted. Noted no overlying erythema, induration, or other signs of local infection. Completed without difficulty using Cryo-Gun. Advised to call if fevers/chills, erythema, induration, drainage, or persistent bleeding.   Patient aware of signs/symptoms requiring further/urgent evaluation.  Follow-up as needed.   Purcell Nails Olevia Bowens, DNP, FNP-C

## 2021-07-20 ENCOUNTER — Other Ambulatory Visit: Payer: Self-pay | Admitting: Obstetrics & Gynecology

## 2021-07-24 IMAGING — US US PELVIS COMPLETE WITH TRANSVAGINAL
1 series · 15 of 25 positions shown · non-contrast
Comparison: 02/10/2020

CLINICAL DATA: RIGHT ovarian cyst. New LEFT LOWER QUADRANT pain.
Ovarian cyst surgery. Gravida 0. LMP in Friday November, 2019.



[Series 1: us pelvis complete with transvaginal · 15 of 105 slices shown]
[im 1/105]
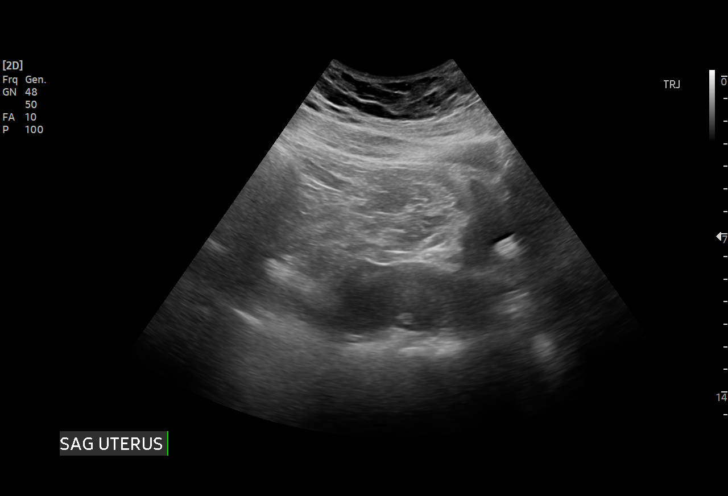
[im 9/105]
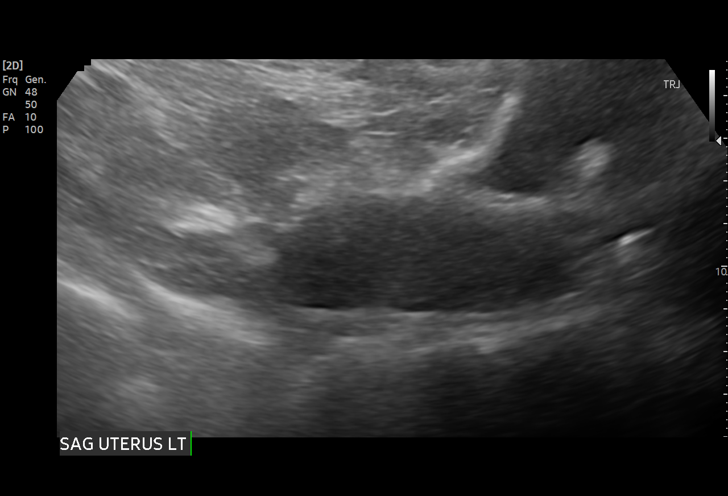
[im 18/105]
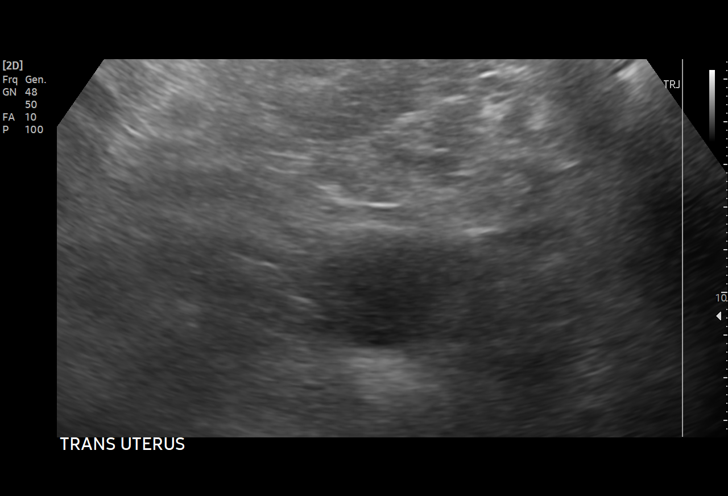
[im 22/105]
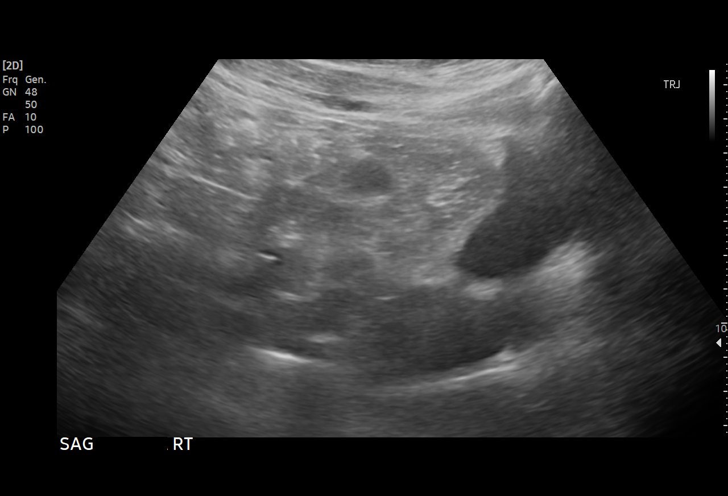
[im 31/105]
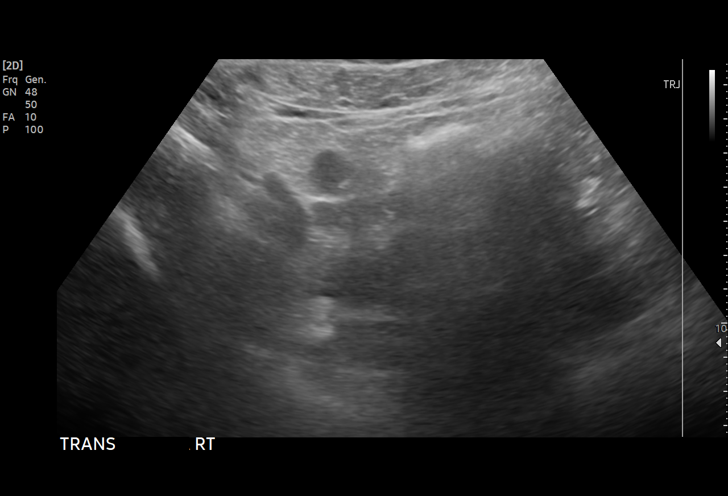
[im 40/105]
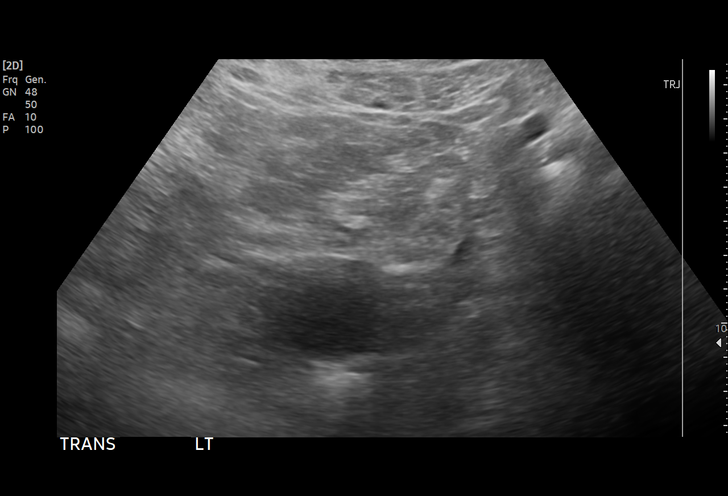
[im 44/105]
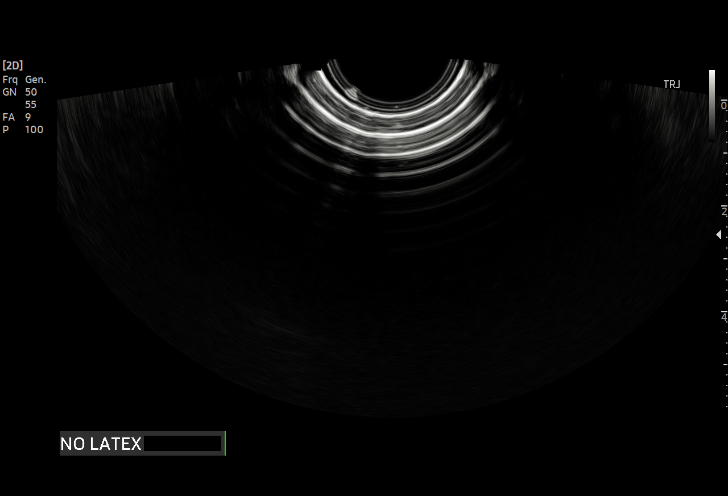
[im 53/105]
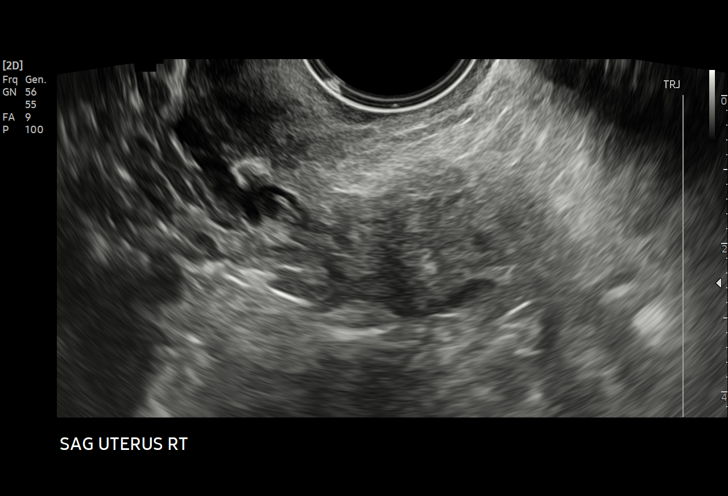
[im 61/105]
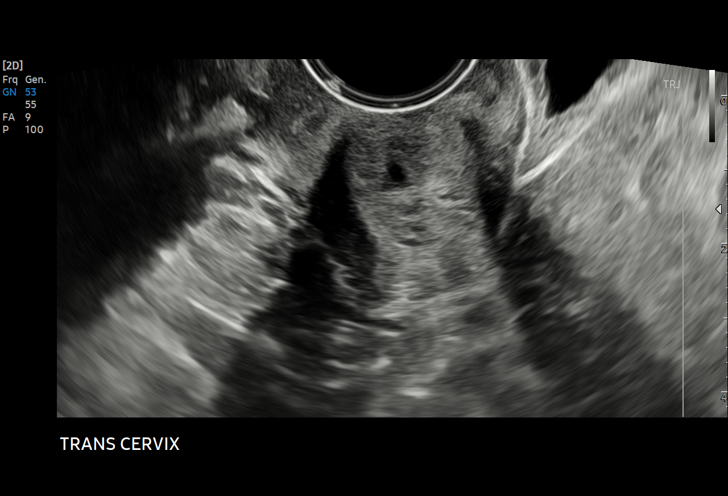
[im 66/105]
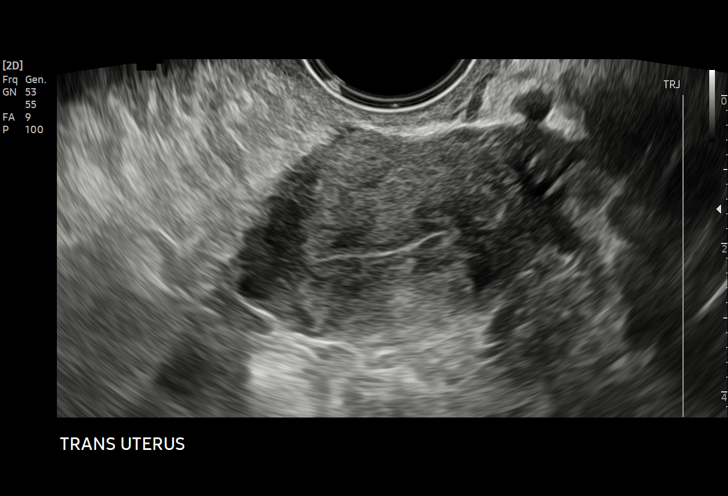
[im 74/105]
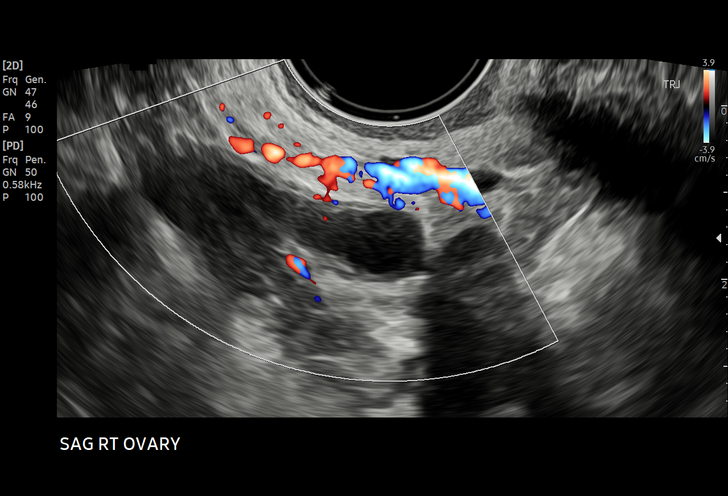
[im 83/105]
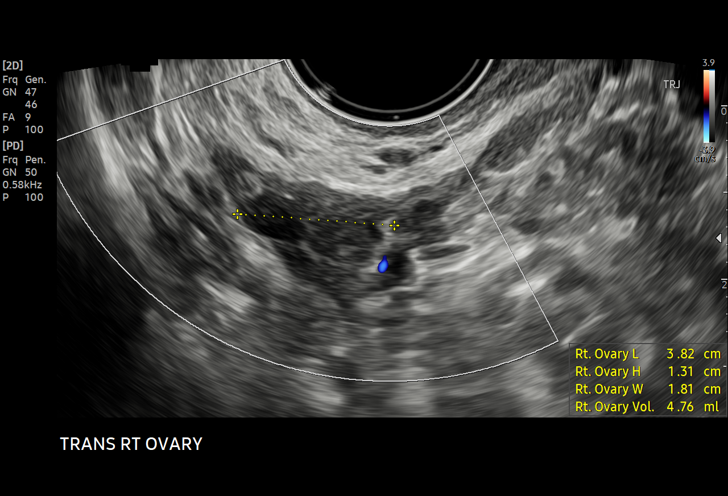
[im 87/105]
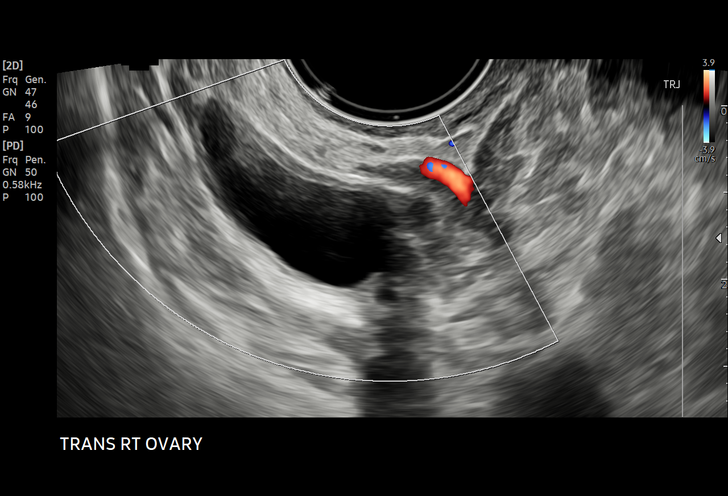
[im 96/105]
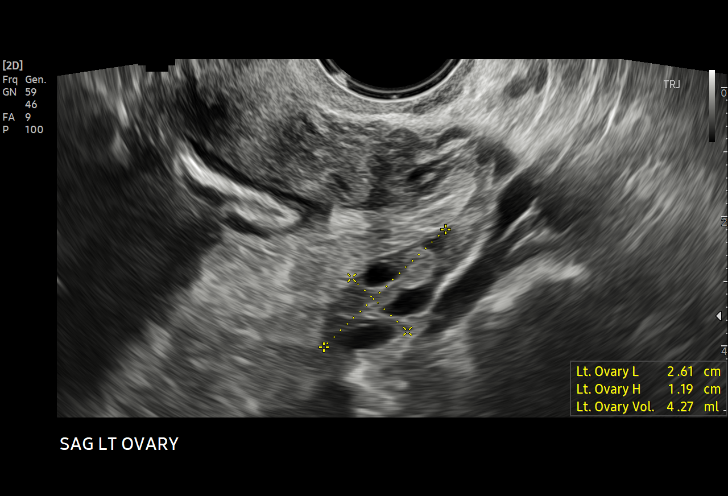
[im 105/105]
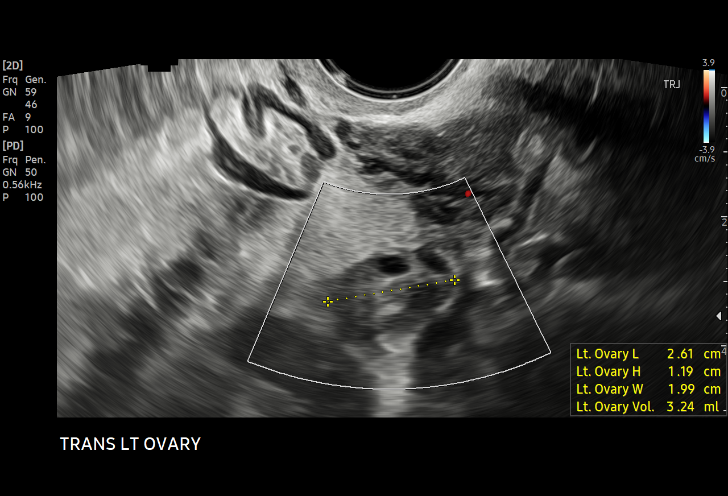

[15 of 25 positions shown; findings below may reference images not displayed]

FINDINGS: Uterus

Measurements: 6.9 x 3.0 x 4.0 centimeter = volume: 43.2 mL. Uterus
is retroverted. No focal mass.

Endometrium

Thickness: 6.5 millimeters. Trilaminar with trace amount of fluid in
the canal.

Right ovary

Measurements: 3.8 x 1.3 x 1.8 centimeters = volume: 4.8 mL. Simple
cyst is 3.3 x 1.1 x 3.5 centimeters. No mural nodularity. Previously
cyst measured 3.2 x 1.5 x 2.7 centimeters.

Left ovary

Measurements: 2.6 x 1.2 x 2.0 centimeter = volume: 3.2 mL. Normal
appearance/no adnexal mass.

Other findings

No abnormal free fluid.
IMPRESSION: 1. Simple RIGHT ovarian cyst measuring 3.5 centimeters. This has
benign characteristics. No imaging follow up is required for
premenopausal females. This follows consensus guidelines: Simple
Adnexal Cysts: SRU Consensus Conference Update on Follow-up and
Reporting.
2. Normal appearance of the LEFT ovary.
3. Retroverted normal appearing uterus.

## 2021-07-25 ENCOUNTER — Other Ambulatory Visit: Payer: Self-pay | Admitting: Obstetrics & Gynecology

## 2021-07-25 ENCOUNTER — Other Ambulatory Visit (HOSPITAL_BASED_OUTPATIENT_CLINIC_OR_DEPARTMENT_OTHER): Payer: Self-pay

## 2021-07-26 ENCOUNTER — Other Ambulatory Visit (HOSPITAL_BASED_OUTPATIENT_CLINIC_OR_DEPARTMENT_OTHER): Payer: Self-pay

## 2021-07-26 ENCOUNTER — Other Ambulatory Visit: Payer: Self-pay | Admitting: Obstetrics & Gynecology

## 2021-07-27 ENCOUNTER — Other Ambulatory Visit: Payer: Self-pay | Admitting: Obstetrics & Gynecology

## 2021-07-27 ENCOUNTER — Other Ambulatory Visit (HOSPITAL_BASED_OUTPATIENT_CLINIC_OR_DEPARTMENT_OTHER): Payer: Self-pay

## 2021-07-30 ENCOUNTER — Other Ambulatory Visit (HOSPITAL_BASED_OUTPATIENT_CLINIC_OR_DEPARTMENT_OTHER): Payer: Self-pay

## 2021-07-30 ENCOUNTER — Ambulatory Visit (INDEPENDENT_AMBULATORY_CARE_PROVIDER_SITE_OTHER): Payer: Managed Care, Other (non HMO) | Admitting: Medical-Surgical

## 2021-07-30 ENCOUNTER — Encounter: Payer: Self-pay | Admitting: Medical-Surgical

## 2021-07-30 ENCOUNTER — Other Ambulatory Visit: Payer: Self-pay

## 2021-07-30 VITALS — BP 125/82 | HR 82 | Resp 20 | Ht 63.01 in | Wt 233.0 lb

## 2021-07-30 DIAGNOSIS — S0185XA Open bite of other part of head, initial encounter: Secondary | ICD-10-CM

## 2021-07-30 DIAGNOSIS — F419 Anxiety disorder, unspecified: Secondary | ICD-10-CM

## 2021-07-30 DIAGNOSIS — W503XXA Accidental bite by another person, initial encounter: Secondary | ICD-10-CM

## 2021-07-30 MED ORDER — FLUCONAZOLE 150 MG PO TABS
150.0000 mg | ORAL_TABLET | Freq: Once | ORAL | 0 refills | Status: AC
  Filled 2021-07-30: qty 2, 3d supply, fill #0

## 2021-07-30 MED ORDER — MUPIROCIN 2 % EX OINT
TOPICAL_OINTMENT | CUTANEOUS | 0 refills | Status: DC
  Filled 2021-07-30: qty 22, 10d supply, fill #0

## 2021-07-30 MED ORDER — VENLAFAXINE HCL ER 75 MG PO CP24
75.0000 mg | ORAL_CAPSULE | Freq: Every day | ORAL | 1 refills | Status: DC
  Filled 2021-07-30: qty 30, 30d supply, fill #0
  Filled 2021-09-10: qty 30, 30d supply, fill #1

## 2021-07-30 MED ORDER — SULFAMETHOXAZOLE-TRIMETHOPRIM 800-160 MG PO TABS
1.0000 | ORAL_TABLET | Freq: Two times a day (BID) | ORAL | 0 refills | Status: DC
Start: 2021-07-30 — End: 2021-08-02
  Filled 2021-07-30: qty 20, 10d supply, fill #0
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
## 2021-08-02 ENCOUNTER — Other Ambulatory Visit (HOSPITAL_BASED_OUTPATIENT_CLINIC_OR_DEPARTMENT_OTHER): Payer: Self-pay

## 2021-08-02 ENCOUNTER — Encounter: Payer: Self-pay | Admitting: Family Medicine

## 2021-08-02 ENCOUNTER — Ambulatory Visit (INDEPENDENT_AMBULATORY_CARE_PROVIDER_SITE_OTHER): Payer: Managed Care, Other (non HMO) | Admitting: Family Medicine

## 2021-08-02 DIAGNOSIS — N739 Female pelvic inflammatory disease, unspecified: Secondary | ICD-10-CM | POA: Insufficient documentation

## 2021-08-02 MED ORDER — DOXYCYCLINE HYCLATE 100 MG PO TABS
100.0000 mg | ORAL_TABLET | Freq: Two times a day (BID) | ORAL | 0 refills | Status: AC
  Filled 2021-08-02: qty 20, 10d supply, fill #0
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
## 2021-08-02 NOTE — Assessment & Plan Note (Addendum)
Small cutaneous abscess.  There is no significant fluctuance palpated.  I will have her change from Bactrim to doxycycline for a little better coverage of her recent bite as well as this abscess.  She will let me know if symptoms are not improving or she has worsening.

## 2021-08-02 NOTE — Patient Instructions (Signed)
Stop bactrim Start doxycycline to replace this.   Let me know if symptoms worsen

## 2021-08-06 ENCOUNTER — Other Ambulatory Visit (HOSPITAL_BASED_OUTPATIENT_CLINIC_OR_DEPARTMENT_OTHER): Payer: Self-pay

## 2021-08-20 ENCOUNTER — Other Ambulatory Visit: Payer: Self-pay | Admitting: Obstetrics & Gynecology

## 2021-08-20 ENCOUNTER — Other Ambulatory Visit (HOSPITAL_BASED_OUTPATIENT_CLINIC_OR_DEPARTMENT_OTHER): Payer: Self-pay

## 2021-08-20 ENCOUNTER — Encounter: Payer: Self-pay | Admitting: Medical-Surgical

## 2021-08-21 ENCOUNTER — Other Ambulatory Visit (HOSPITAL_BASED_OUTPATIENT_CLINIC_OR_DEPARTMENT_OTHER): Payer: Self-pay

## 2021-08-21 ENCOUNTER — Other Ambulatory Visit: Payer: Self-pay

## 2021-08-21 DIAGNOSIS — B001 Herpesviral vesicular dermatitis: Secondary | ICD-10-CM

## 2021-08-21 MED ORDER — VALACYCLOVIR HCL 500 MG PO TABS
500.0000 mg | ORAL_TABLET | Freq: Two times a day (BID) | ORAL | 12 refills | Status: DC
  Filled 2021-08-21: qty 30, 15d supply, fill #0
  Filled 2021-09-10: qty 30, 15d supply, fill #1
  Filled 2021-10-09: qty 30, 15d supply, fill #2
  Filled 2021-11-06: qty 30, 15d supply, fill #3
  Filled 2021-11-30: qty 30, 15d supply, fill #4
  Filled 2022-01-01: qty 30, 15d supply, fill #5
  Filled 2022-01-22: qty 30, 15d supply, fill #6
  Filled 2022-02-15: qty 30, 15d supply, fill #7
  Filled 2022-03-18: qty 30, 15d supply, fill #8
  Filled 2022-04-08: qty 30, 15d supply, fill #9
  Filled 2022-05-01: qty 30, 15d supply, fill #10
  Filled 2022-05-30: qty 30, 15d supply, fill #11
  Filled 2022-06-21: qty 30, 15d supply, fill #12

## 2021-08-21 NOTE — Addendum Note (Signed)
Addended by: Kathie Dike on: 08/21/2021 11:02 AM   Modules accepted: Orders

## 2021-08-21 NOTE — Progress Notes (Addendum)
Pt requested refill of Valtrex through [facility]. Refill sent.

## 2021-08-27 ENCOUNTER — Other Ambulatory Visit: Payer: Self-pay | Admitting: Obstetrics & Gynecology

## 2021-08-27 ENCOUNTER — Ambulatory Visit (INDEPENDENT_AMBULATORY_CARE_PROVIDER_SITE_OTHER): Payer: Managed Care, Other (non HMO) | Admitting: Medical-Surgical

## 2021-08-27 ENCOUNTER — Other Ambulatory Visit (HOSPITAL_BASED_OUTPATIENT_CLINIC_OR_DEPARTMENT_OTHER): Payer: Self-pay

## 2021-08-27 ENCOUNTER — Other Ambulatory Visit: Payer: Self-pay

## 2021-08-27 ENCOUNTER — Encounter: Payer: Self-pay | Admitting: Medical-Surgical

## 2021-08-27 VITALS — BP 123/79 | HR 84 | Resp 20 | Ht 63.0 in | Wt 236.0 lb

## 2021-08-27 DIAGNOSIS — F419 Anxiety disorder, unspecified: Secondary | ICD-10-CM | POA: Diagnosis not present

## 2021-08-27 DIAGNOSIS — J029 Acute pharyngitis, unspecified: Secondary | ICD-10-CM | POA: Diagnosis not present

## 2021-08-27 DIAGNOSIS — J4521 Mild intermittent asthma with (acute) exacerbation: Secondary | ICD-10-CM | POA: Diagnosis not present

## 2021-08-27 DIAGNOSIS — B001 Herpesviral vesicular dermatitis: Secondary | ICD-10-CM

## 2021-08-27 DIAGNOSIS — J019 Acute sinusitis, unspecified: Secondary | ICD-10-CM | POA: Diagnosis not present

## 2021-08-27 MED ORDER — ALBUTEROL SULFATE HFA 108 (90 BASE) MCG/ACT IN AERS: 2.0000 | INHALATION_SPRAY | Freq: Four times a day (QID) | RESPIRATORY_TRACT | 2 refills | Status: AC | PRN

## 2021-08-27 MED ORDER — PREDNISONE 50 MG PO TABS: 50.0000 mg | ORAL_TABLET | Freq: Every day | ORAL | 0 refills | Status: DC

## 2021-08-27 MED ORDER — AZITHROMYCIN 250 MG PO TABS: ORAL_TABLET | ORAL | 0 refills | Status: AC

## 2021-08-27 MED ORDER — GUAIFENESIN-CODEINE 100-10 MG/5ML PO SOLN
5.0000 mL | Freq: Three times a day (TID) | ORAL | 0 refills | Status: DC | PRN
Start: 2021-08-27 — End: 2021-11-21

## 2021-08-27 NOTE — Progress Notes (Signed)
  HPI with pertinent ROS:   CC: mood follow up  HPI: Pleasant 20 year old female presenting today for mood follow up. She is taking Effexor 75mg  daily, tolerating well without side effects. Feels that the medication is helping but does admit that she often feels overwhelmed. She is currently working 2 jobs and going to school but also has to help take care of her other family members. She is helping her mom pay bills and has worry about financial strain. Understands that these stressors cannot be treated with medication and is not sure that she would want to increase the dose of her medication. Does note that she is not as irritable as she was before.  Had a headache start last Monday followed by unusual fatigue, sinus congestion, cough, and sore throat throughout the rest of the week. 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 Having worsened shortness of breath and chest tightness for the last couple of days. No fever, chills, chest pain, or GI symptoms.   I reviewed the past medical history, family history, social history, surgical history, and allergies today and no changes were needed.  Please see the problem list section below in epic for further details.   Physical exam:   General: Well Developed, well nourished, and in no acute distress.  Neuro: Alert and oriented x3.  HEENT: Normocephalic, atraumatic, pupils equal round reactive to light, neck supple, no masses, no lymphadenopathy, thyroid nonpalpable.  Skin: Warm and dry. Cardiac: Regular rate and rhythm, no murmurs rubs or gallops, no lower extremity edema.  Respiratory: Clear but diminished bilaterally. Not using accessory muscles, speaking in full sentences.  Impression and Recommendations:    1. Anxiety Continue Effexor 75mg  daily as prescribed. Continue efforts to modify daily responsibilities as she under a lot of pressure for someone her age. Would likely benefit from  journaling or using planners to help organize her various responsibilities to help prevent feelings of being overwhelmed.   2. Sore throat Flu swab negative.   3. Mild intermittent asthma with acute exacerbation 4. Acute non-recurrent sinusitis, unspecified location Refilling albuterol inhaler. Prednisone 50mg  daily x 5 days. Azithromycin 500mg  today with 250mg  daily for the next 4 days. Drink plenty of fluids and work to increase rest. Work note provided in paper form and sent to patient via Monday.   Return in about 3 months (around 11/27/2021) for mood follow up. ___________________________________________ , DNP, APRN, FNP-BC Primary Care and Sports Medicine [facility]

## 2021-09-10 ENCOUNTER — Other Ambulatory Visit (HOSPITAL_BASED_OUTPATIENT_CLINIC_OR_DEPARTMENT_OTHER): Payer: Self-pay

## 2021-09-11 ENCOUNTER — Other Ambulatory Visit (HOSPITAL_BASED_OUTPATIENT_CLINIC_OR_DEPARTMENT_OTHER): Payer: Self-pay

## 2021-09-12 ENCOUNTER — Other Ambulatory Visit (HOSPITAL_BASED_OUTPATIENT_CLINIC_OR_DEPARTMENT_OTHER): Payer: Self-pay

## 2021-10-09 ENCOUNTER — Other Ambulatory Visit: Payer: Self-pay

## 2021-10-09 ENCOUNTER — Ambulatory Visit (INDEPENDENT_AMBULATORY_CARE_PROVIDER_SITE_OTHER): Payer: Managed Care, Other (non HMO) | Admitting: Medical-Surgical

## 2021-10-09 ENCOUNTER — Other Ambulatory Visit (HOSPITAL_BASED_OUTPATIENT_CLINIC_OR_DEPARTMENT_OTHER): Payer: Self-pay

## 2021-10-09 ENCOUNTER — Encounter: Payer: Self-pay | Admitting: Medical-Surgical

## 2021-10-09 VITALS — BP 120/77 | HR 86 | Resp 20 | Ht 63.0 in | Wt 233.2 lb

## 2021-10-09 DIAGNOSIS — J40 Bronchitis, not specified as acute or chronic: Secondary | ICD-10-CM | POA: Diagnosis not present

## 2021-10-09 DIAGNOSIS — J329 Chronic sinusitis, unspecified: Secondary | ICD-10-CM | POA: Diagnosis not present

## 2021-10-09 DIAGNOSIS — J101 Influenza due to other identified influenza virus with other respiratory manifestations: Secondary | ICD-10-CM

## 2021-10-09 LAB — POCT INFLUENZA A/B
Influenza A, POC: POSITIVE — AB
Influenza B, POC: NEGATIVE

## 2021-10-09 MED ORDER — AMOXICILLIN-POT CLAVULANATE 875-125 MG PO TABS
1.0000 | ORAL_TABLET | Freq: Two times a day (BID) | ORAL | 0 refills | Status: AC
  Filled 2021-10-09: qty 20, 10d supply, fill #0

## 2021-10-09 MED ORDER — PREDNISONE 50 MG PO TABS
50.0000 mg | ORAL_TABLET | Freq: Every day | ORAL | 0 refills | Status: DC
  Filled 2021-10-09: qty 5, 5d supply, fill #0

## 2021-10-09 MED ORDER — AZELASTINE HCL 0.1 % NA SOLN
2.0000 | Freq: Two times a day (BID) | NASAL | 1 refills | Status: DC
  Filled 2021-10-09: qty 30, 30d supply, fill #0

## 2021-10-09 MED ORDER — BENZONATATE 200 MG PO CAPS
200.0000 mg | ORAL_CAPSULE | Freq: Three times a day (TID) | ORAL | 0 refills | Status: DC | PRN
  Filled 2021-10-09: qty 45, 15d supply, fill #0

## 2021-10-09 NOTE — Progress Notes (Signed)
°  HPI with pertinent ROS:   CC: cough, sinus pressure  HPI: Pleasant 20 year old female presenting today for evaluation of 6-7 days of sinus pressure, headache, dry throat, nasal congestion, and intractable coughing.  She started with symptoms last Thursday that were mild but worsened abruptly over Friday and Saturday.  She had a fever Saturday and Sunday but was unable to check her temperature because they could not find a thermometer.  Now she has noted that her throat has gotten very sore and her ears are hurting bilaterally.  She has been using DayQuil and NyQuil with no benefit.  She was prescribed guaifenesin with codeine which has not been helpful.  She does have Tessalon pearls at home however she was worried about taking these along with a cough syrup and wanted to check with me first.  Completed a COVID test yesterday with negative results.  Felt like she was getting better yesterday but today her symptoms have worsened.  I reviewed the past medical history, family history, social history, surgical history, and allergies today and no changes were needed.  Please see the problem list section below in epic for further details.   Physical exam:   General: Well Developed, well nourished, and in no acute distress.  Neuro: Alert and oriented x3.  HEENT: Normocephalic, atraumatic.  Bilateral external ear canals patent, normal TMs.  Maxillary sinus tenderness.  Submandibular lymphadenopathy with mild tenderness. Skin: Warm and dry. Cardiac: Regular rate and rhythm, no murmurs rubs or gallops, no lower extremity edema.  Respiratory: Clear to auscultation bilaterally. Not using accessory muscles, speaking in full sentences.  Impression and Recommendations:    1. Influenza A POCT influenza swab positive for flu A.  2. Sinobronchitis With the duration of illness along with worsening of symptoms today, treating for sinobronchitis.  Starting Augmentin twice daily x10 days.  Sending in [place] 3 times daily as needed for cough.  Astelin nasal spray twice daily as needed.  We will go ahead and do a burst of prednisone to help with her asthma type symptoms. - amoxicillin-clavulanate (AUGMENTIN) 875-125 MG tablet; Take 1 tablet by mouth 2 (two) times daily for 10 days.  Dispense: 20 tablet; Refill: 0 - benzonatate (TESSALON) 200 MG capsule; Take 1 capsule (200 mg total) by mouth 3 (three) times daily as needed for cough.  Dispense: 45 capsule; Refill: 0 - azelastine (ASTELIN) 0.1 % nasal spray; Place 2 sprays into both nostrils 2 (two) times daily. Use in each nostril as directed  Dispense: 301 mL; Refill: 1 - predniSONE (DELTASONE) 50 MG tablet; Take 1 tablet (50 mg total) by mouth daily.  Dispense: 5 tablet; Refill: 0 - POCT Influenza A/B  Return if symptoms worsen or fail to improve. ___________________________________________ [name], DNP, APRN, FNP-BC Primary Care and Sports Medicine [facility] [place]

## 2021-10-10 ENCOUNTER — Other Ambulatory Visit: Payer: Self-pay | Admitting: Medical-Surgical

## 2021-10-10 DIAGNOSIS — F419 Anxiety disorder, unspecified: Secondary | ICD-10-CM

## 2021-10-11 ENCOUNTER — Other Ambulatory Visit (HOSPITAL_BASED_OUTPATIENT_CLINIC_OR_DEPARTMENT_OTHER): Payer: Self-pay

## 2021-10-11 MED ORDER — VENLAFAXINE HCL ER 75 MG PO CP24
75.0000 mg | ORAL_CAPSULE | Freq: Every day | ORAL | 1 refills | Status: DC
  Filled 2021-10-11: qty 90, 90d supply, fill #0
  Filled 2022-01-22: qty 90, 90d supply, fill #1

## 2021-11-06 ENCOUNTER — Other Ambulatory Visit (HOSPITAL_BASED_OUTPATIENT_CLINIC_OR_DEPARTMENT_OTHER): Payer: Self-pay

## 2021-11-21 ENCOUNTER — Ambulatory Visit (HOSPITAL_BASED_OUTPATIENT_CLINIC_OR_DEPARTMENT_OTHER): Admission: RE | Admit: 2021-11-21 | Payer: 59 | Source: Ambulatory Visit

## 2021-11-21 ENCOUNTER — Ambulatory Visit (INDEPENDENT_AMBULATORY_CARE_PROVIDER_SITE_OTHER): Payer: 59 | Admitting: Physician Assistant

## 2021-11-21 ENCOUNTER — Other Ambulatory Visit: Payer: Self-pay

## 2021-11-21 ENCOUNTER — Encounter: Payer: Self-pay | Admitting: Physician Assistant

## 2021-11-21 VITALS — BP 133/67 | HR 92 | Temp 98.0°F | Ht 63.0 in | Wt 229.0 lb

## 2021-11-21 DIAGNOSIS — R1031 Right lower quadrant pain: Secondary | ICD-10-CM

## 2021-11-21 DIAGNOSIS — M549 Dorsalgia, unspecified: Secondary | ICD-10-CM | POA: Diagnosis not present

## 2021-11-21 DIAGNOSIS — R1033 Periumbilical pain: Secondary | ICD-10-CM

## 2021-11-21 DIAGNOSIS — R110 Nausea: Secondary | ICD-10-CM

## 2021-11-21 LAB — POCT URINE PREGNANCY: Preg Test, Ur: NEGATIVE

## 2021-11-21 LAB — POCT URINALYSIS DIP (CLINITEK)
Bilirubin, UA: NEGATIVE
Glucose, UA: NEGATIVE mg/dL
Ketones, POC UA: NEGATIVE mg/dL
Leukocytes, UA: NEGATIVE
Nitrite, UA: NEGATIVE
Spec Grav, UA: >=1.03 — AB (ref 1.010–1.025)
Urobilinogen, UA: 0.2 E.U./dL
pH, UA: 6 (ref 5.0–8.0)

## 2021-11-21 NOTE — Patient Instructions (Signed)
Need to rule out appendicitis. Get labs.

## 2021-11-21 NOTE — Progress Notes (Signed)
Subjective:    Patient ID: Beth Holmes, female    DOB: Feb 12, 2001, 21 y.o.   MRN: [id number]  HPI Pt is a 21 yo obese female with 4 days of worsening belly button pain radiating into right lower quadrant and back. She has had some abdominal cramping for a few weeks but this feels much worse. She has hx of endometriosis as well but feels different than that. She has an IUD to help with endometriosis symptoms. She is sexually active. No vaginal discharge, odor, pain. She is having more urinary urgency and frequency but no pain. She does do a lot of heavy lifting and originally thought she had pulled a muscle. She has some irregular vaginal bleeding but normal for her. No bowel changes or hematochezia or melena. She does have nausea. No appetite. Not taking anything to make better.  .. 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     Objective:   Physical Exam Vitals reviewed. Exam conducted with a chaperone present.  Constitutional:      Appearance: She is well-developed. She is obese.  HENT:     Head: Normocephalic.  Cardiovascular:     Rate and Rhythm: Normal rate and regular rhythm.  Abdominal:     General: Bowel sounds are increased. There is distension.     Palpations: Abdomen is soft. There is no mass.     Tenderness: There is abdominal tenderness in the right lower quadrant and  periumbilical area. There is no right CVA tenderness, left CVA tenderness, guarding or rebound. Positive signs include psoas sign. Negative signs include Murphy's sign.     Hernia: No hernia is present.  Neurological:     General: No focal deficit present.     Mental Status: She is alert.  Psychiatric:        Mood and Affect: Mood normal.          Assessment & Plan:  Marland KitchenMarland KitchenShermeka was seen today for abdominal pain.  Diagnoses and all orders for this visit:  Periumbilical abdominal pain -     CBC w/Diff/Platelet -     COMPLETE METABOLIC PANEL WITH GFR -     Lipase -     CT ABDOMEN PELVIS W CONTRAST; Future -     POCT URINALYSIS DIP (CLINITEK) -     POCT urine pregnancy -     Urine Culture  Right lower quadrant abdominal pain -     CBC w/Diff/Platelet -     COMPLETE METABOLIC PANEL WITH GFR -     Lipase -     POCT URINALYSIS DIP (CLINITEK) -     POCT urine pregnancy -     Urine Culture  Nausea -     CBC w/Diff/Platelet -     COMPLETE METABOLIC PANEL WITH GFR -  Lipase -     POCT URINALYSIS DIP (CLINITEK) -     POCT urine pregnancy -     Urine Culture  Pain radiating to back  Unclear etiology of pain.  Vitals no concerns.  UPT negative  UA had blood and protein. Will culture. CBC/CMP/Lipase ordered CT to rule out diverticulitis, appendicitis, ovarian torsion.  Stay hydrated will call with results and next step.  Stick to tylenol for pain.

## 2021-11-22 LAB — CBC WITH DIFFERENTIAL/PLATELET
Absolute Monocytes: 846 cells/uL (ref 200–950)
Basophils Absolute: 38 cells/uL (ref 0–200)
Basophils Relative: 0.4 %
Eosinophils Absolute: 76 cells/uL (ref 15–500)
Eosinophils Relative: 0.8 %
HCT: 39.2 % (ref 35.0–45.0)
Hemoglobin: 12.6 g/dL (ref 11.7–15.5)
Lymphs Abs: 3743 cells/uL (ref 850–3900)
MCH: 25.2 pg — ABNORMAL LOW (ref 27.0–33.0)
MCHC: 32.1 g/dL (ref 32.0–36.0)
MCV: 78.4 fL — ABNORMAL LOW (ref 80.0–100.0)
MPV: 11.7 fL (ref 7.5–12.5)
Monocytes Relative: 8.9 %
Neutro Abs: 4798 cells/uL (ref 1500–7800)
Neutrophils Relative %: 50.5 %
Platelets: 172 10*3/uL (ref 140–400)
RBC: 5 10*6/uL (ref 3.80–5.10)
RDW: 13.9 % (ref 11.0–15.0)
Total Lymphocyte: 39.4 %
WBC: 9.5 10*3/uL (ref 3.8–10.8)

## 2021-11-22 LAB — COMPLETE METABOLIC PANEL WITH GFR
AG Ratio: 1.6 (calc) (ref 1.0–2.5)
ALT: 17 U/L (ref 5–32)
AST: 13 U/L (ref 12–32)
Albumin: 3.9 g/dL (ref 3.6–5.1)
Alkaline phosphatase (APISO): 67 U/L (ref 36–128)
BUN: 9 mg/dL (ref 7–20)
CO2: 28 mmol/L (ref 20–32)
Calcium: 9 mg/dL (ref 8.9–10.4)
Chloride: 104 mmol/L (ref 98–110)
Creat: 0.65 mg/dL (ref 0.50–0.96)
Globulin: 2.4 g/dL (calc) (ref 2.0–3.8)
Glucose, Bld: 81 mg/dL (ref 65–99)
Potassium: 3.9 mmol/L (ref 3.8–5.1)
Sodium: 139 mmol/L (ref 135–146)
Total Bilirubin: 0.3 mg/dL (ref 0.2–1.1)
Total Protein: 6.3 g/dL (ref 6.3–8.2)
eGFR: 130 mL/min/{1.73_m2} (ref >=60)

## 2021-11-22 LAB — LIPASE: Lipase: 60 U/L (ref 7–60)

## 2021-11-22 NOTE — Progress Notes (Signed)
Did she get CT scan?  WBC looks good.  Kidney, liver, glucose look good.  Normal lipase.

## 2021-11-23 ENCOUNTER — Ambulatory Visit (HOSPITAL_BASED_OUTPATIENT_CLINIC_OR_DEPARTMENT_OTHER)
Admission: RE | Admit: 2021-11-23 | Discharge: 2021-11-23 | Disposition: A | Discharge status: Home / Self Care | Payer: 59 | Source: Ambulatory Visit | Attending: Physician Assistant | Admitting: Physician Assistant

## 2021-11-23 ENCOUNTER — Other Ambulatory Visit: Payer: Self-pay

## 2021-11-23 ENCOUNTER — Encounter (HOSPITAL_BASED_OUTPATIENT_CLINIC_OR_DEPARTMENT_OTHER): Payer: Self-pay

## 2021-11-23 DIAGNOSIS — R1033 Periumbilical pain: Secondary | ICD-10-CM | POA: Diagnosis present

## 2021-11-23 LAB — URINE CULTURE
MICRO NUMBER:: 12984475
SPECIMEN QUALITY:: ADEQUATE

## 2021-11-23 MED ORDER — IOHEXOL 300 MG/ML  SOLN
100.0000 mL | Freq: Once | INTRAMUSCULAR | Status: AC | PRN
  Administered 2021-11-23: 100 mL via INTRAVENOUS

## 2021-11-23 NOTE — Progress Notes (Signed)
CT normal. No cause for pt's abdominal pain. Let's get your stools moving better. Start miralax 1 capful in 4oz twice a day until stooling regularly.

## 2021-11-23 NOTE — Progress Notes (Signed)
No infection in urine

## 2021-11-27 ENCOUNTER — Ambulatory Visit: Payer: 59 | Admitting: Medical-Surgical

## 2021-11-30 ENCOUNTER — Other Ambulatory Visit (HOSPITAL_BASED_OUTPATIENT_CLINIC_OR_DEPARTMENT_OTHER): Payer: Self-pay

## 2022-01-01 ENCOUNTER — Other Ambulatory Visit (HOSPITAL_BASED_OUTPATIENT_CLINIC_OR_DEPARTMENT_OTHER): Payer: Self-pay

## 2022-01-07 ENCOUNTER — Encounter: Payer: Self-pay | Admitting: Medical-Surgical

## 2022-01-08 ENCOUNTER — Other Ambulatory Visit: Payer: Self-pay

## 2022-01-08 ENCOUNTER — Encounter: Payer: Self-pay | Admitting: Medical-Surgical

## 2022-01-08 ENCOUNTER — Ambulatory Visit (INDEPENDENT_AMBULATORY_CARE_PROVIDER_SITE_OTHER): Payer: 59 | Admitting: Medical-Surgical

## 2022-01-08 VITALS — BP 110/73 | HR 74 | Resp 20 | Ht 63.0 in | Wt 223.3 lb

## 2022-01-08 DIAGNOSIS — G43909 Migraine, unspecified, not intractable, without status migrainosus: Secondary | ICD-10-CM

## 2022-01-08 MED ORDER — ONDANSETRON 8 MG PO TBDP
8.0000 mg | ORAL_TABLET | Freq: Three times a day (TID) | ORAL | 3 refills | Status: DC | PRN
Start: 2022-01-08 — End: 2022-11-27

## 2022-01-08 MED ORDER — RIZATRIPTAN BENZOATE 5 MG PO TBDP: 5.0000 mg | ORAL_TABLET | ORAL | 0 refills | Status: DC | PRN

## 2022-01-08 MED ORDER — TOPIRAMATE 25 MG PO TABS: ORAL_TABLET | ORAL | 1 refills | Status: DC

## 2022-01-08 NOTE — Progress Notes (Signed)
Medical screening examination/treatment was performed by qualified nurse practitioner student and as supervising provider I was immediately available for consultation/collaboration. I have reviewed documentation and agree with assessment and plan. ° °Arnola Crittendon L. Denaja Verhoeven, DNP, APRN, FNP-BC °[facility] [place] °Primary Care and Sports Medicine ° °

## 2022-01-08 NOTE — Progress Notes (Addendum)
?  HPI with pertinent ROS:  ? ?CC: headache ? ?[id number] 21 y.o. female presenting for a headache. Reports headache that started on Sunday and is still present. Headache in her middle of head, and behind both eyes. States that she just started bleeding and cramping with her IUD and wonders if her headache is hormone related. She reports not being able to sleep well and occasional nausea. States if the headache gets really intense her vision will be blurry. She ran out of rizatriptan and has taken ibuprofen and tylenol with little relief. Reports having a headache every couple days. Never received a call from the neurologist.  ? ? ?I reviewed the past medical history, family history, social history, surgical history, and allergies today and no changes were needed.  Please see the problem list section below in epic for further details. ? ? ?Physical exam:  ? ?General: Well Developed, well nourished, and in no acute distress.  ?Neuro: Alert and oriented x3 ?HEENT: Normocephalic, atraumatic. EOMI, frontal sinus tender to palpate ?Skin: Warm and dry, no rashes. ?Cardiac: Regular rate and rhythm, no murmurs rubs or gallops, no lower extremity edema.  ?Respiratory: Clear to auscultation bilaterally. Not using accessory muscles, speaking in full sentences. ? ?Impression and Recommendations:   ? ?1. Migraine without status migrainosus, not intractable, unspecified migraine type ?Sending in a refill for rizatriptan, to take as prescribed. Discussed trying a preventative medication to see if this will decrease amount or severity of headaches. Start Topamax 25mg  once daily for one week and then twice daily, per patient request. Take Zofran 8mg  as needed for nausea. Reentering referral to Neurology .Work note provided. ? ?- rizatriptan (MAXALT-MLT) 5 MG disintegrating tablet; Take 1 tablet (5 mg total) by mouth as needed for migraine. May repeat in 2 hours if needed  Dispense: 10 tablet; Refill: 0 ?- topiramate (TOPAMAX) 25 MG  tablet; Take 1 tablet daily for one week then increase to 2 tablets daily.  Dispense: 90 tablet; Refill: 1 ?- ondansetron (ZOFRAN-ODT) 8 MG disintegrating tablet; Take 1 tablet (8 mg total) by mouth every 8 (eight) hours as needed for nausea.  Dispense: 20 tablet; Refill: 3 ? ? ?Return in about 6 weeks (around 02/19/2022) for migraine follow up. ?___________________________________________ ?Jeanann Lewandowsky, Student NP ?

## 2022-01-22 ENCOUNTER — Other Ambulatory Visit (HOSPITAL_BASED_OUTPATIENT_CLINIC_OR_DEPARTMENT_OTHER): Payer: Self-pay

## 2022-02-01 DIAGNOSIS — Z30431 Encounter for routine checking of intrauterine contraceptive device: Secondary | ICD-10-CM | POA: Diagnosis not present

## 2022-02-01 DIAGNOSIS — N930 Postcoital and contact bleeding: Secondary | ICD-10-CM | POA: Diagnosis not present

## 2022-02-01 DIAGNOSIS — N9419 Other specified dyspareunia: Secondary | ICD-10-CM | POA: Diagnosis not present

## 2022-02-01 DIAGNOSIS — N72 Inflammatory disease of cervix uteri: Secondary | ICD-10-CM | POA: Diagnosis not present

## 2022-02-02 DIAGNOSIS — N930 Postcoital and contact bleeding: Secondary | ICD-10-CM | POA: Insufficient documentation

## 2022-02-02 DIAGNOSIS — N9419 Other specified dyspareunia: Secondary | ICD-10-CM | POA: Insufficient documentation

## 2022-02-05 ENCOUNTER — Other Ambulatory Visit: Payer: Self-pay | Admitting: Obstetrics and Gynecology

## 2022-02-05 DIAGNOSIS — Z30431 Encounter for routine checking of intrauterine contraceptive device: Secondary | ICD-10-CM

## 2022-02-06 ENCOUNTER — Ambulatory Visit (INDEPENDENT_AMBULATORY_CARE_PROVIDER_SITE_OTHER): Payer: 59

## 2022-02-06 ENCOUNTER — Other Ambulatory Visit (HOSPITAL_BASED_OUTPATIENT_CLINIC_OR_DEPARTMENT_OTHER): Payer: Self-pay

## 2022-02-06 DIAGNOSIS — N939 Abnormal uterine and vaginal bleeding, unspecified: Secondary | ICD-10-CM | POA: Diagnosis not present

## 2022-02-06 DIAGNOSIS — Z30431 Encounter for routine checking of intrauterine contraceptive device: Secondary | ICD-10-CM

## 2022-02-06 DIAGNOSIS — Z975 Presence of (intrauterine) contraceptive device: Secondary | ICD-10-CM

## 2022-02-15 ENCOUNTER — Other Ambulatory Visit: Payer: Self-pay

## 2022-02-15 ENCOUNTER — Other Ambulatory Visit (HOSPITAL_BASED_OUTPATIENT_CLINIC_OR_DEPARTMENT_OTHER): Payer: Self-pay

## 2022-02-15 MED ORDER — MEDROXYPROGESTERONE ACETATE 10 MG PO TABS
ORAL_TABLET | ORAL | 0 refills | Status: DC
  Filled 2022-02-15: qty 60, 30d supply, fill #0

## 2022-02-19 ENCOUNTER — Ambulatory Visit: Payer: 59 | Admitting: Medical-Surgical

## 2022-02-20 ENCOUNTER — Other Ambulatory Visit (HOSPITAL_BASED_OUTPATIENT_CLINIC_OR_DEPARTMENT_OTHER): Payer: Self-pay

## 2022-02-20 ENCOUNTER — Other Ambulatory Visit: Payer: Self-pay

## 2022-02-21 ENCOUNTER — Other Ambulatory Visit (HOSPITAL_BASED_OUTPATIENT_CLINIC_OR_DEPARTMENT_OTHER): Payer: Self-pay

## 2022-02-21 MED ORDER — MEDROXYPROGESTERONE ACETATE 10 MG PO TABS
ORAL_TABLET | ORAL | 0 refills | Status: DC
  Filled 2022-02-21 – 2022-02-22 (×2): qty 60, 30d supply, fill #0

## 2022-02-22 ENCOUNTER — Other Ambulatory Visit: Payer: Self-pay

## 2022-02-22 ENCOUNTER — Other Ambulatory Visit (HOSPITAL_BASED_OUTPATIENT_CLINIC_OR_DEPARTMENT_OTHER): Payer: Self-pay

## 2022-03-08 ENCOUNTER — Other Ambulatory Visit (HOSPITAL_BASED_OUTPATIENT_CLINIC_OR_DEPARTMENT_OTHER): Payer: Self-pay

## 2022-03-08 ENCOUNTER — Other Ambulatory Visit: Payer: Self-pay

## 2022-03-08 MED ORDER — NORETHINDRONE ACETATE 5 MG PO TABS
5.0000 mg | ORAL_TABLET | Freq: Every day | ORAL | 11 refills | Status: DC
  Filled 2022-03-08 – 2022-03-12 (×2): qty 30, 30d supply, fill #0

## 2022-03-08 MED ORDER — DOXYCYCLINE HYCLATE 100 MG PO TABS
ORAL_TABLET | ORAL | 0 refills | Status: DC
  Filled 2022-03-08 – 2022-04-08 (×2): qty 14, 7d supply, fill #0

## 2022-03-08 MED ORDER — NORETHINDRONE ACETATE 5 MG PO TABS
5.0000 mg | ORAL_TABLET | Freq: Every day | ORAL | 11 refills | Status: DC
  Filled 2022-03-08: qty 30, 30d supply, fill #0

## 2022-03-08 MED ORDER — DOXYCYCLINE HYCLATE 100 MG PO CAPS
ORAL_CAPSULE | ORAL | 0 refills | Status: DC
  Filled 2022-03-08 – 2022-03-12 (×2): qty 14, 7d supply, fill #0

## 2022-03-12 ENCOUNTER — Other Ambulatory Visit (HOSPITAL_BASED_OUTPATIENT_CLINIC_OR_DEPARTMENT_OTHER): Payer: Self-pay

## 2022-03-15 ENCOUNTER — Other Ambulatory Visit: Payer: Self-pay

## 2022-03-18 ENCOUNTER — Other Ambulatory Visit (HOSPITAL_BASED_OUTPATIENT_CLINIC_OR_DEPARTMENT_OTHER): Payer: Self-pay

## 2022-04-08 ENCOUNTER — Other Ambulatory Visit: Payer: Self-pay

## 2022-04-08 ENCOUNTER — Other Ambulatory Visit (HOSPITAL_BASED_OUTPATIENT_CLINIC_OR_DEPARTMENT_OTHER): Payer: Self-pay

## 2022-04-11 DIAGNOSIS — L03113 Cellulitis of right upper limb: Secondary | ICD-10-CM | POA: Diagnosis not present

## 2022-04-11 DIAGNOSIS — Z23 Encounter for immunization: Secondary | ICD-10-CM | POA: Diagnosis not present

## 2022-04-11 DIAGNOSIS — W5501XA Bitten by cat, initial encounter: Secondary | ICD-10-CM | POA: Diagnosis not present

## 2022-04-15 ENCOUNTER — Other Ambulatory Visit: Payer: Self-pay

## 2022-04-25 ENCOUNTER — Other Ambulatory Visit: Payer: Self-pay

## 2022-04-25 ENCOUNTER — Encounter: Payer: Self-pay | Admitting: Medical-Surgical

## 2022-04-25 ENCOUNTER — Other Ambulatory Visit (HOSPITAL_BASED_OUTPATIENT_CLINIC_OR_DEPARTMENT_OTHER): Payer: Self-pay

## 2022-04-25 ENCOUNTER — Other Ambulatory Visit: Payer: Self-pay | Admitting: Medical-Surgical

## 2022-04-25 DIAGNOSIS — F419 Anxiety disorder, unspecified: Secondary | ICD-10-CM

## 2022-04-25 MED ORDER — VENLAFAXINE HCL ER 75 MG PO CP24
75.0000 mg | ORAL_CAPSULE | Freq: Every day | ORAL | 1 refills | Status: DC
  Filled 2022-04-25: qty 90, 90d supply, fill #0
  Filled 2022-08-02: qty 90, 90d supply, fill #1

## 2022-05-01 ENCOUNTER — Other Ambulatory Visit (HOSPITAL_BASED_OUTPATIENT_CLINIC_OR_DEPARTMENT_OTHER): Payer: Self-pay

## 2022-05-30 ENCOUNTER — Other Ambulatory Visit (HOSPITAL_BASED_OUTPATIENT_CLINIC_OR_DEPARTMENT_OTHER): Payer: Self-pay

## 2022-05-30 ENCOUNTER — Other Ambulatory Visit: Payer: Self-pay | Admitting: Medical-Surgical

## 2022-05-30 DIAGNOSIS — G43909 Migraine, unspecified, not intractable, without status migrainosus: Secondary | ICD-10-CM

## 2022-06-21 ENCOUNTER — Other Ambulatory Visit (HOSPITAL_BASED_OUTPATIENT_CLINIC_OR_DEPARTMENT_OTHER): Payer: Self-pay

## 2022-07-19 ENCOUNTER — Other Ambulatory Visit: Payer: Self-pay | Admitting: Obstetrics & Gynecology

## 2022-07-19 ENCOUNTER — Other Ambulatory Visit (HOSPITAL_BASED_OUTPATIENT_CLINIC_OR_DEPARTMENT_OTHER): Payer: Self-pay

## 2022-07-19 DIAGNOSIS — B001 Herpesviral vesicular dermatitis: Secondary | ICD-10-CM

## 2022-07-24 ENCOUNTER — Other Ambulatory Visit: Payer: Self-pay

## 2022-07-24 ENCOUNTER — Encounter: Payer: Self-pay | Admitting: Medical-Surgical

## 2022-07-24 ENCOUNTER — Other Ambulatory Visit (HOSPITAL_BASED_OUTPATIENT_CLINIC_OR_DEPARTMENT_OTHER): Payer: Self-pay

## 2022-07-24 DIAGNOSIS — K120 Recurrent oral aphthae: Secondary | ICD-10-CM

## 2022-07-24 MED ORDER — VALACYCLOVIR HCL 500 MG PO TABS
500.0000 mg | ORAL_TABLET | Freq: Two times a day (BID) | ORAL | 0 refills | Status: DC
  Filled 2022-07-24: qty 30, 15d supply, fill #0

## 2022-07-24 NOTE — Progress Notes (Signed)
Pt requested refill of Valtrex. Refill sent.

## 2022-08-02 ENCOUNTER — Other Ambulatory Visit (HOSPITAL_BASED_OUTPATIENT_CLINIC_OR_DEPARTMENT_OTHER): Payer: Self-pay

## 2022-08-23 ENCOUNTER — Other Ambulatory Visit: Payer: Self-pay | Admitting: Obstetrics & Gynecology

## 2022-08-23 DIAGNOSIS — K120 Recurrent oral aphthae: Secondary | ICD-10-CM

## 2022-08-26 ENCOUNTER — Other Ambulatory Visit (HOSPITAL_BASED_OUTPATIENT_CLINIC_OR_DEPARTMENT_OTHER): Payer: Self-pay

## 2022-08-26 ENCOUNTER — Encounter: Payer: Self-pay | Admitting: Medical-Surgical

## 2022-08-26 ENCOUNTER — Ambulatory Visit (INDEPENDENT_AMBULATORY_CARE_PROVIDER_SITE_OTHER): Payer: 59 | Admitting: Medical-Surgical

## 2022-08-26 VITALS — BP 117/76 | HR 84 | Temp 97.5°F | Resp 20 | Ht 63.0 in | Wt 224.7 lb

## 2022-08-26 DIAGNOSIS — H66011 Acute suppurative otitis media with spontaneous rupture of ear drum, right ear: Secondary | ICD-10-CM

## 2022-08-26 DIAGNOSIS — G43909 Migraine, unspecified, not intractable, without status migrainosus: Secondary | ICD-10-CM

## 2022-08-26 DIAGNOSIS — K120 Recurrent oral aphthae: Secondary | ICD-10-CM

## 2022-08-26 MED ORDER — DOXYCYCLINE HYCLATE 100 MG PO TABS
100.0000 mg | ORAL_TABLET | Freq: Two times a day (BID) | ORAL | 0 refills | Status: AC
  Filled 2022-08-26: qty 14, 7d supply, fill #0

## 2022-08-26 MED ORDER — TOPIRAMATE 25 MG PO TABS
25.0000 mg | ORAL_TABLET | Freq: Two times a day (BID) | ORAL | 1 refills | Status: DC
  Filled 2022-08-26 – 2023-01-22 (×2): qty 180, 90d supply, fill #0

## 2022-08-26 MED ORDER — VALACYCLOVIR HCL 500 MG PO TABS
500.0000 mg | ORAL_TABLET | Freq: Two times a day (BID) | ORAL | 1 refills | Status: DC | PRN
  Filled 2022-08-26: qty 60, 30d supply, fill #0
  Filled 2022-10-18: qty 60, 30d supply, fill #1
  Filled 2022-12-13: qty 60, 30d supply, fill #2
  Filled 2023-02-07: qty 60, 30d supply, fill #3
  Filled 2023-03-28: qty 60, 30d supply, fill #4
  Filled 2023-05-22 (×2): qty 60, 30d supply, fill #5

## 2022-08-26 MED ORDER — TOPIRAMATE 25 MG PO TABS
ORAL_TABLET | ORAL | 1 refills | Status: DC
  Filled 2022-08-26: qty 50, 30d supply, fill #0

## 2022-08-26 MED ORDER — TOPIRAMATE 25 MG PO TABS
25.0000 mg | ORAL_TABLET | Freq: Two times a day (BID) | ORAL | 1 refills | Status: DC
  Filled 2022-08-26: qty 180, 90d supply, fill #0

## 2022-08-26 NOTE — Progress Notes (Signed)
Established Patient Office Visit  Subjective   Patient ID: Beth Holmes, female   DOB: 06-18-2001 Age: 21 y.o. MRN: [id number]   Chief Complaint  Patient presents with   Respiratory Distress   Sore Throat   Ear Pain   Cough   Epistaxis    HPI Pleasant 22 year old female presenting today for further evaluation of upper respiratory symptoms including right ear pain.  Notes that she was originally seen approximately 4 weeks ago and treated with penicillin and prednisone for a possible throat abscess.  Her symptoms never improved and she notes that her right ear was hurting at the time.  She has been seen since and was determined to have a right ear infection.  Currently on day 5 of Augmentin twice daily.  Admits that she took the Augmentin 1 time daily for the first 2 days due to a misunderstanding of the instructions.  Yesterday, she accidentally took it 3 times rather than twice.  Today, she reports that she still has a hoarse voice, sore throat, cough productive of thick white/green mucus, intermittent epistaxis with sneezing/blowing her nose, some dyspnea on exertion that is worse at night, fatigue, headache, and ear pain.  Her right ear has been ringing constantly and she reports that it is leaking pus with some blood present.  She called the urgent care that she saw for the ear infection and ask them if the blood and pus coming out of her ear was normal.  Reports that their answer was yes this is to be expected.  Denies fevers, chills, nausea, vomiting, chest pain, and facial pain/pressure.   Objective:    Vitals:   08/26/22 1054  BP: 117/76  Pulse: 84  Temp: (!) 97.5 F (36.4 C)  Resp: 20  Height: 5\' 3"  (1.6 m)  Weight: 224 lb 11.2 oz (101.9 kg)  SpO2: 98%  BMI (Calculated): 39.81    Physical Exam Vitals reviewed.  Constitutional:      General: She is not in acute distress.    Appearance: Normal appearance. She is not ill-appearing.  HENT:     Head: Normocephalic and  atraumatic.     Right Ear: Decreased hearing noted. Drainage and tenderness present. Tympanic membrane is perforated and erythematous.     Left Ear: Tympanic membrane and ear canal normal.  Cardiovascular:     Rate and Rhythm: Normal rate and regular rhythm.     Pulses: Normal pulses.     Heart sounds: Normal heart sounds.  Pulmonary:     Effort: Pulmonary effort is normal. No respiratory distress.     Breath sounds: Normal breath sounds. No wheezing, rhonchi or rales.  Skin:    General: Skin is warm and dry.  Neurological:     Mental Status: She is alert and oriented to person, place, and time.  Psychiatric:        Mood and Affect: Mood normal.        Behavior: Behavior normal.        Thought Content: Thought content normal.        Judgment: Judgment normal.      No results found for this or any previous visit (from the past 24 hour(s)).     The ASCVD Risk score (Arnett DK, et al., 2019) failed to calculate for the following reasons:   The 2019 ASCVD risk score is only valid for ages 17 to 61   Assessment & Plan:   1. Migraine without status migrainosus, not intractable, unspecified migraine  type Refilling Topamax at patient request.  Continue 25 mg twice daily as prescribed. - topiramate (TOPAMAX) 25 MG tablet; Take 1 tablet (25 mg total) by mouth 2 (two) times daily.  Dispense: 180 tablet; Refill: 1  2. HSV 1 oral Refilling Valtrex 500 mg twice daily as needed. - valACYclovir (VALTREX) 500 MG tablet; Take 1 tablet (500 mg total) by mouth 2 (two) times daily as needed.  Dispense: 180 tablet; Refill: 1  3. Non-recurrent acute suppurative otitis media of right ear with spontaneous rupture of tympanic membrane On exam, concern for TM rupture.  No response to Augmentin or penicillin.  Switching to doxycycline 100 mg twice daily for 7 days.  Urgent referral to ENT for further evaluation placed. - doxycycline (VIBRA-TABS) 100 MG tablet; Take 1 tablet (100 mg total) by mouth 2  (two) times daily for 7 days.  Dispense: 14 tablet; Refill: 0 - Ambulatory referral to ENT  Return if symptoms worsen or fail to improve.  ___________________________________________ [name], DNP, APRN, FNP-BC Primary Care and Sports Medicine [facility] [place]

## 2022-08-27 ENCOUNTER — Other Ambulatory Visit (HOSPITAL_BASED_OUTPATIENT_CLINIC_OR_DEPARTMENT_OTHER): Payer: Self-pay

## 2022-09-16 ENCOUNTER — Other Ambulatory Visit: Payer: Self-pay

## 2022-09-16 ENCOUNTER — Telehealth: Payer: Self-pay

## 2022-09-16 ENCOUNTER — Encounter (HOSPITAL_BASED_OUTPATIENT_CLINIC_OR_DEPARTMENT_OTHER): Payer: Self-pay

## 2022-09-16 DIAGNOSIS — N939 Abnormal uterine and vaginal bleeding, unspecified: Secondary | ICD-10-CM | POA: Insufficient documentation

## 2022-09-16 DIAGNOSIS — R0981 Nasal congestion: Secondary | ICD-10-CM | POA: Insufficient documentation

## 2022-09-16 DIAGNOSIS — R6883 Chills (without fever): Secondary | ICD-10-CM | POA: Insufficient documentation

## 2022-09-16 LAB — COMPREHENSIVE METABOLIC PANEL
ALT: 20 U/L (ref 0–44)
AST: 12 U/L — ABNORMAL LOW (ref 15–41)
Albumin: 4.2 g/dL (ref 3.5–5.0)
Alkaline Phosphatase: 64 U/L (ref 38–126)
Anion gap: 8 (ref 5–15)
BUN: 12 mg/dL (ref 6–20)
CO2: 24 mmol/L (ref 22–32)
Calcium: 8.9 mg/dL (ref 8.9–10.3)
Chloride: 103 mmol/L (ref 98–111)
Creatinine, Ser: 0.61 mg/dL (ref 0.44–1.00)
GFR, Estimated: >60 mL/min (ref >60)
Glucose, Bld: 91 mg/dL (ref 70–99)
Potassium: 3.7 mmol/L (ref 3.5–5.1)
Sodium: 135 mmol/L (ref 135–145)
Total Bilirubin: 0.2 mg/dL — ABNORMAL LOW (ref 0.3–1.2)
Total Protein: 7.4 g/dL (ref 6.5–8.1)

## 2022-09-16 LAB — URINALYSIS, ROUTINE W REFLEX MICROSCOPIC
Bilirubin Urine: NEGATIVE
Glucose, UA: NEGATIVE mg/dL
Hgb urine dipstick: NEGATIVE
Ketones, ur: NEGATIVE mg/dL
Leukocytes,Ua: NEGATIVE
Nitrite: NEGATIVE
Protein, ur: NEGATIVE mg/dL
Specific Gravity, Urine: 1.005 (ref 1.005–1.030)
pH: 7 (ref 5.0–8.0)

## 2022-09-16 LAB — CBC
HCT: 40.1 % (ref 36.0–46.0)
Hemoglobin: 12.9 g/dL (ref 12.0–15.0)
MCH: 25.8 pg — ABNORMAL LOW (ref 26.0–34.0)
MCHC: 32.2 g/dL (ref 30.0–36.0)
MCV: 80.2 fL (ref 80.0–100.0)
Platelets: 248 10*3/uL (ref 150–400)
RBC: 5 MIL/uL (ref 3.87–5.11)
RDW: 13.2 % (ref 11.5–15.5)
WBC: 11 10*3/uL — ABNORMAL HIGH (ref 4.0–10.5)
nRBC: 0 % (ref 0.0–0.2)

## 2022-09-16 LAB — LIPASE, BLOOD: Lipase: 17 U/L (ref 11–51)

## 2022-09-16 LAB — PREGNANCY, URINE: Preg Test, Ur: NEGATIVE

## 2022-09-16 NOTE — Telephone Encounter (Signed)
Pt called stating she needed to be seen due to the concern of retained product.  Pt had miscarriage in July and has been bleeding on and off since. Pt has abdominal pain with an occasional fever. Pt has tested negative for flu and covid.   Pt states she has a dark rust color discharge with string like material in the blood clots.   Spoke with Dr. Charlotta Newton and Charlotta Newton we can work her in within the next week for a quant and ultrasound or pt can go to the hospital.   Scheduled pt for Thursday at 11:10am with Dr. Para March.

## 2022-09-16 NOTE — ED Triage Notes (Signed)
Patient here POV from Home.  Endorses Miscarriage in August ans since then has had Irregular Vaginal Bleeding. More recently (8-9 Weeks) the patient has been having Chills and Aches. Has been treated with Steroids and Antibiotic Therapy without much relief.   Some nausea and Emesis. Mild Diarrhea Intermittently.   NAD Noted during Triage. A&Ox4. Gcs 15. Ambulatory.

## 2022-09-17 ENCOUNTER — Emergency Department (HOSPITAL_BASED_OUTPATIENT_CLINIC_OR_DEPARTMENT_OTHER)
Admission: EM | Admit: 2022-09-17 | Discharge: 2022-09-17 | Disposition: A | Discharge status: Home / Self Care | Payer: 59 | Attending: Emergency Medicine | Admitting: Emergency Medicine

## 2022-09-17 ENCOUNTER — Telehealth: Payer: Self-pay | Admitting: General Practice

## 2022-09-17 ENCOUNTER — Emergency Department (HOSPITAL_BASED_OUTPATIENT_CLINIC_OR_DEPARTMENT_OTHER): Payer: 59

## 2022-09-17 ENCOUNTER — Other Ambulatory Visit (HOSPITAL_BASED_OUTPATIENT_CLINIC_OR_DEPARTMENT_OTHER): Payer: Self-pay

## 2022-09-17 ENCOUNTER — Ambulatory Visit (INDEPENDENT_AMBULATORY_CARE_PROVIDER_SITE_OTHER): Payer: 59 | Admitting: Family Medicine

## 2022-09-17 ENCOUNTER — Encounter: Payer: Self-pay | Admitting: Family Medicine

## 2022-09-17 VITALS — BP 112/74 | HR 68 | Temp 97.9°F | Ht 63.0 in | Wt 225.3 lb

## 2022-09-17 DIAGNOSIS — R5382 Chronic fatigue, unspecified: Secondary | ICD-10-CM

## 2022-09-17 DIAGNOSIS — N939 Abnormal uterine and vaginal bleeding, unspecified: Secondary | ICD-10-CM

## 2022-09-17 DIAGNOSIS — R6883 Chills (without fever): Secondary | ICD-10-CM

## 2022-09-17 DIAGNOSIS — J329 Chronic sinusitis, unspecified: Secondary | ICD-10-CM

## 2022-09-17 MED ORDER — NORETHINDRONE ACETATE 5 MG PO TABS
5.0000 mg | ORAL_TABLET | Freq: Every day | ORAL | 11 refills | Status: DC
  Filled 2022-09-17: qty 30, 30d supply, fill #0
  Filled 2022-11-15: qty 30, 30d supply, fill #1
  Filled 2022-12-13: qty 30, 30d supply, fill #2
  Filled 2023-01-22: qty 30, 30d supply, fill #3
  Filled 2023-02-26: qty 30, 30d supply, fill #4
  Filled 2023-03-28: qty 30, 30d supply, fill #5
  Filled 2023-05-02: qty 30, 30d supply, fill #6
  Filled 2023-05-28: qty 30, 30d supply, fill #7
  Filled 2023-06-27: qty 30, 30d supply, fill #8
  Filled 2023-08-01: qty 30, 30d supply, fill #9

## 2022-09-17 NOTE — Progress Notes (Signed)
Acute Office Visit  Subjective:     Patient ID: Beth Holmes, female    DOB: 2001-09-14, 21 y.o.   MRN: [id number]  Chief Complaint  Patient presents with   Cough    Patient seen in ER last night  - c/o fatigue, heart flutters. Hoarsness, lingering cough headache, weakness, diziness, cold intolerance, not feelilng well x 8 weeks - BP was elevaed at ER last night - [place] was done at visit - was told they could not visualize her Right ovary.     Cough Pertinent negatives include no chest pain, headaches or heartburn.  Patient is in today for acute visit. Pt is new to me.   She reports 8 weeks of headaches, whole body aches, stomach pains. She reports she had miscarriage the end of August. She had + pregnancy test at home. She reports she took another one at home a day between and this was + she says when she finally went to her OB/GYN a month later, this test was negative. She says she always had issues with her ovaries as a child to where she sees OB/GYN every 6 months with [facility]. She reports she had appt already scheduled the end of August. She says they didn't have any openings. She says she was bleeding with clots but never went to ER during that time. She says by the time she saw OB, her urine pregnancy test was negative.  She reports surgery with cyst removal from right ovary in 2016 and then July 2022 had to have cyst off right and left ovary with uterine biopsy which confirmed endometriosis. She then had IUD placed during this time. She says she has bled since the end of July 2023.  She reports since the end of July, she gets sick often and fast. She was diagnosed with sinusitis last month with different antibiotics. She was referred to ENT after abx and prednisone. She says they prescribed her abx and prednisone rinse but she is unable to get due to costs.  Pt reports she is always fatigue and tired. She also has hx of palpitations and cold intolerance.  Has hx of anxiety taking  Effexor.  Past Medical History:  Diagnosis Date   Anxiety    Asthma    Chronic pelvic pain in female    Depression    Ovarian cyst    rt side    Review of Systems  Constitutional:  Positive for malaise/fatigue.  HENT:  Positive for sinus pain and tinnitus.   Eyes:  Negative for double vision and pain.  Respiratory:  Positive for cough.   Cardiovascular:  Negative for chest pain and claudication.  Gastrointestinal:  Negative for heartburn and nausea.  Genitourinary:  Negative for dysuria and flank pain.  Musculoskeletal:  Negative for back pain and neck pain.  Neurological:  Negative for dizziness and headaches.  Psychiatric/Behavioral:  Negative for depression. The patient is nervous/anxious.   All other systems reviewed and are negative.       Objective:    BP 112/74   Pulse 68   Temp 97.9 F (36.6 C)   Ht 5\' 3"  (1.6 m)   Wt 225 lb 5 oz (102.2 kg)   SpO2 98%   BMI 39.91 kg/m    Physical Exam Vitals and nursing note reviewed.  Constitutional:      Appearance: She is obese.  HENT:     Head: Normocephalic and atraumatic.     Right Ear: Tympanic membrane, ear canal and  external ear normal.     Left Ear: Tympanic membrane, ear canal and external ear normal.     Nose: Nose normal.     Mouth/Throat:     Mouth: Mucous membranes are moist.     Pharynx: Oropharynx is clear.  Eyes:     Extraocular Movements: Extraocular movements intact.     Conjunctiva/sclera: Conjunctivae normal.     Pupils: Pupils are equal, round, and reactive to light.  Cardiovascular:     Rate and Rhythm: Normal rate and regular rhythm.     Pulses: Normal pulses.     Heart sounds: Normal heart sounds.  Pulmonary:     Effort: Pulmonary effort is normal.     Breath sounds: Normal breath sounds.  Abdominal:     General: Abdomen is flat. Bowel sounds are normal.     Palpations: Abdomen is soft.  Skin:    General: Skin is warm.  Neurological:     Mental Status: She is alert and oriented to  person, place, and time. Mental status is at baseline.  Psychiatric:        Mood and Affect: Mood normal.        Thought Content: Thought content normal.        Judgment: Judgment normal.     Comments: Slightly anxious     Results for orders placed or performed during the hospital encounter of 09/17/22  Lipase, blood  Result Value Ref Range   Lipase 17 11 - 51 U/L  Comprehensive metabolic panel  Result Value Ref Range   Sodium 135 135 - 145 mmol/L   Potassium 3.7 3.5 - 5.1 mmol/L   Chloride 103 98 - 111 mmol/L   CO2 24 22 - 32 mmol/L   Glucose, Bld 91 70 - 99 mg/dL   BUN 12 6 - 20 mg/dL   Creatinine, Ser 6.33 0.44 - 1.00 mg/dL   Calcium 8.9 8.9 - 35.4 mg/dL   Total Protein 7.4 6.5 - 8.1 g/dL   Albumin 4.2 3.5 - 5.0 g/dL   AST 12 (L) 15 - 41 U/L   ALT 20 0 - 44 U/L   Alkaline Phosphatase 64 38 - 126 U/L   Total Bilirubin 0.2 (L) 0.3 - 1.2 mg/dL   GFR, Estimated >56 >25 mL/min   Anion gap 8 5 - 15  CBC  Result Value Ref Range   WBC 11.0 (H) 4.0 - 10.5 K/uL   RBC 5.00 3.87 - 5.11 MIL/uL   Hemoglobin 12.9 12.0 - 15.0 g/dL   HCT 63.8 93.7 - 34.2 %   MCV 80.2 80.0 - 100.0 fL   MCH 25.8 (L) 26.0 - 34.0 pg   MCHC 32.2 30.0 - 36.0 g/dL   RDW 87.6 81.1 - 57.2 %   Platelets 248 150 - 400 K/uL   nRBC 0.0 0.0 - 0.2 %  Urinalysis, Routine w reflex microscopic Urine, Clean Catch  Result Value Ref Range   Color, Urine COLORLESS (A) YELLOW   APPearance CLEAR CLEAR   Specific Gravity, Urine 1.005 1.005 - 1.030   pH 7.0 5.0 - 8.0   Glucose, UA NEGATIVE NEGATIVE mg/dL   Hgb urine dipstick NEGATIVE NEGATIVE   Bilirubin Urine NEGATIVE NEGATIVE   Ketones, ur NEGATIVE NEGATIVE mg/dL   Protein, ur NEGATIVE NEGATIVE mg/dL   Nitrite NEGATIVE NEGATIVE   Leukocytes,Ua NEGATIVE NEGATIVE  Pregnancy, urine  Result Value Ref Range   Preg Test, Ur NEGATIVE NEGATIVE        Assessment & Plan:  Problem List Items Addressed This Visit   None Visit Diagnoses     Recurrent sinusitis     -  Primary   Relevant Orders   CT MAXILLOFACIAL W CONTRAST   Chronic fatigue       Relevant Orders   TSH   T4, free   Vaginal bleeding          With recurrent sinusitis not improving, refer for CT sinus. Advised to try to get the rinse prescribed to her by ENT unable to afford this at the time. To see OB/GYN with [facility] this week for further management of vaginal bleeding. Pt does have IUD placed. She also reports a miscarriage in July 2022 by home pregnancy tests x 2. Never went for evaluation with provider until a month later. At that time, pregnancy test was negative and was also  negative last night in ER.  For fatigue, getting TSH/T4  To see PCP prn for continued chronic condition follow up.  No orders of the defined types were placed in this encounter.   No follow-ups on file.  Suzan Slick, MD

## 2022-09-17 NOTE — Progress Notes (Unsigned)
GYNECOLOGY OFFICE VISIT NOTE  History:   Beth Holmes is a 21 y.o. G0P0000 here today for fever, bleeding with IUD s/p SAB in July.  She went to the ED a couple days ago and had TVUS which showed EL 3.37mm and no RPOC. She has an IUD in place. No Cx done. WBC 11.5. No adnexal pathology.   Historically: She had CPP/dysmenorrhea for which she was referred to [facility]. She had a L/S and surgery as needed for her new diagnosis of endometriosis and she had an IUD placed in 2022. She then conceived on the IUD and had a SAB. The IUD was in place throughout. Prior to the SAB she had occasional BTB but it has been much worse since that time with pretty much daily bleeding since the SAB.   She was prescribed Aygestin by Dr. Clementeen Graham [facility] but has not yet started it.      Past Medical History:  Diagnosis Date   Anxiety    Asthma    Chronic pelvic pain in female    Depression    Ovarian cyst    rt side    Past Surgical History:  Procedure Laterality Date   ADENOIDECTOMY     OVARIAN CYST SURGERY     TONSILLECTOMY     UPPER GASTROINTESTINAL ENDOSCOPY  08/11/2020    The following portions of the patient's history were reviewed and updated as appropriate: allergies, current medications, past family history, past medical history, past social history, past surgical history and problem list.   Health Maintenance:   Diagnosis  Date Value Ref Range Status  11/20/2020   Final   - Negative for intraepithelial lesion or malignancy (NILM)    Review of Systems:  Pertinent items noted in HPI and remainder of comprehensive ROS otherwise negative.  Physical Exam:  BP 124/84   Pulse 87   Temp 98.4 F (36.9 C)   Resp 16   Ht 5\' 3"  (1.6 m)   Wt 225 lb (102.1 kg)   BMI 39.86 kg/m  CONSTITUTIONAL: Well-developed, well-nourished female in no acute distress.  HEENT:  Normocephalic, atraumatic. External right and left ear normal. No scleral icterus.  NECK: Normal range of motion, supple, no  masses noted on observation SKIN: No rash noted. Not diaphoretic. No erythema. No pallor. MUSCULOSKELETAL: Normal range of motion. No edema noted. NEUROLOGIC: Alert and oriented to person, place, and time. Normal muscle tone coordination. No cranial nerve deficit noted. PSYCHIATRIC: Normal mood and affect. Normal behavior. Normal judgment and thought content.  CARDIOVASCULAR: Normal heart rate noted RESPIRATORY: Effort and breath sounds normal, no problems with respiration noted ABDOMEN: No masses noted. No other overt distention noted.    PELVIC: Normal appearing external genitalia; normal urethral meatus; normal appearing vaginal mucosa and cervix.  No abnormal discharge noted.  Normal uterine size, no other palpable masses. Generalized tenderness on exam - no chandelier sign.  Performed in the presence of a chaperone  Labs and Imaging Results for orders placed or performed in visit on 09/17/22 (from the past 168 hour(s))  T4, free   Collection Time: 09/17/22 11:05 AM  Result Value Ref Range   Free T4 1.33 0.82 - 1.77 ng/dL  TSH   Collection Time: 09/17/22 11:05 AM  Result Value Ref Range   TSH 0.917 0.450 - 4.500 uIU/mL  Results for orders placed or performed during the hospital encounter of 09/17/22 (from the past 168 hour(s))  Lipase, blood   Collection Time: 09/16/22  9:00 PM  Result Value Ref Range   Lipase 17 11 - 51 U/L  Comprehensive metabolic panel   Collection Time: 09/16/22  9:00 PM  Result Value Ref Range   Sodium 135 135 - 145 mmol/L   Potassium 3.7 3.5 - 5.1 mmol/L   Chloride 103 98 - 111 mmol/L   CO2 24 22 - 32 mmol/L   Glucose, Bld 91 70 - 99 mg/dL   BUN 12 6 - 20 mg/dL   Creatinine, Ser 9.73 0.44 - 1.00 mg/dL   Calcium 8.9 8.9 - 53.2 mg/dL   Total Protein 7.4 6.5 - 8.1 g/dL   Albumin 4.2 3.5 - 5.0 g/dL   AST 12 (L) 15 - 41 U/L   ALT 20 0 - 44 U/L   Alkaline Phosphatase 64 38 - 126 U/L   Total Bilirubin 0.2 (L) 0.3 - 1.2 mg/dL   GFR, Estimated >99 >24  mL/min   Anion gap 8 5 - 15  CBC   Collection Time: 09/16/22  9:00 PM  Result Value Ref Range   WBC 11.0 (H) 4.0 - 10.5 K/uL   RBC 5.00 3.87 - 5.11 MIL/uL   Hemoglobin 12.9 12.0 - 15.0 g/dL   HCT 26.8 34.1 - 96.2 %   MCV 80.2 80.0 - 100.0 fL   MCH 25.8 (L) 26.0 - 34.0 pg   MCHC 32.2 30.0 - 36.0 g/dL   RDW 22.9 79.8 - 92.1 %   Platelets 248 150 - 400 K/uL   nRBC 0.0 0.0 - 0.2 %  Urinalysis, Routine w reflex microscopic Urine, Clean Catch   Collection Time: 09/16/22  9:01 PM  Result Value Ref Range   Color, Urine COLORLESS (A) YELLOW   APPearance CLEAR CLEAR   Specific Gravity, Urine 1.005 1.005 - 1.030   pH 7.0 5.0 - 8.0   Glucose, UA NEGATIVE NEGATIVE mg/dL   Hgb urine dipstick NEGATIVE NEGATIVE   Bilirubin Urine NEGATIVE NEGATIVE   Ketones, ur NEGATIVE NEGATIVE mg/dL   Protein, ur NEGATIVE NEGATIVE mg/dL   Nitrite NEGATIVE NEGATIVE   Leukocytes,Ua NEGATIVE NEGATIVE  Pregnancy, urine   Collection Time: 09/16/22  9:01 PM  Result Value Ref Range   Preg Test, Ur NEGATIVE NEGATIVE   US PELVIC COMPLETE WITH TRANSVAGINAL  Result Date: 09/17/2022 CLINICAL DATA:  Initial evaluation for vaginal bleeding. History of prior miscarriage in August. EXAM: TRANSABDOMINAL AND TRANSVAGINAL ULTRASOUND OF PELVIS TECHNIQUE: Both transabdominal and transvaginal ultrasound examinations of the pelvis were performed. Transabdominal technique was performed for global imaging of the pelvis including uterus, ovaries, adnexal regions, and pelvic cul-de-sac. It was necessary to proceed with endovaginal exam following the transabdominal exam to visualize the endometrium. COMPARISON:  Prior ultrasound from 02/06/2022. FINDINGS: Uterus Measurements: 6.3 x 3.2 x 4.6 cm = volume: 48.5 mL. Uterus is retroverted. No discrete fibroid or other myometrial abnormality. Endometrium Thickness: 3.3 mm. IUD in appropriate position within the endometrial cavity. No other focal abnormality. Right ovary Not visualized.  No  adnexal mass. Left ovary Measurements: 3.7 x 1.5 x 1.5 cm = volume: 4.4 mL. Normal appearance/no adnexal mass. Other findings No abnormal free fluid. IMPRESSION: 1. No acute abnormality within the pelvis. 2. Endometrial stripe within normal limits measuring 3.3 mm in thickness. If bleeding remains unresponsive to hormonal or medical therapy, sonohysterogram should be considered for focal lesion work-up. (Ref: Radiological Reasoning: Algorithmic Workup of Abnormal Vaginal Bleeding with Endovaginal Sonography and Sonohysterography. AJR 2008; 194:R74-08) 3. IUD in appropriate position within the endometrial cavity. 4. Normal left ovary, with  nonvisualization of the right ovary. No adnexal mass or free fluid. Electronically Signed   By: Rise Mu M.D.   On: 09/17/2022 01:33    Assessment and Plan:   1. Breakthrough bleeding with IUD - Check GC/CT and vaginitis panel - If negative, can try Aygestin to help with BTB - If Aygestin does not work she then has 3 options; New IUD Remove IUD and do COCs Keep IUD and do COCs.  - She will consider her options and let us know about the Aygestin. Instructed to try Aygestin for about 3 weeks to see improvement. Reviewed if it works can try to do it both short and long term - both are safe.     Routine preventative health maintenance measures emphasized. Please refer to After Visit Summary for other counseling recommendations.   No follow-ups on file.  Milas Hock, MD, FACOG Obstetrician & Gynecologist, [facility] for [facility], [facility]

## 2022-09-17 NOTE — Telephone Encounter (Signed)
Transition Care Management Follow-up Telephone Call Date of discharge and from where: 09/17/22 from [facility] How have you been since you were released from the hospital? Saw Dr. Wyline Mood today.  Any questions or concerns? No

## 2022-09-17 NOTE — ED Provider Notes (Signed)
[facility] EMERGENCY DEPT  Provider Note  CSN: [id number] Arrival date & time: 09/16/22 2044  History Chief Complaint  Patient presents with   Chills    Beth Holmes is a 21 y.o. female here for feeling sick the last 2-3 months. She had a miscarriage in August, has had persistent daily vaginal bleeding since then but has not been able to get back in to see her Gyn in [facility] since that time. She reports a variety of other complaints including low grade fevers, chills, nasal congestion, heart fluttering and lower abdominal pains throughout that time frame. She has been seen by UC, PCP and ENT for her symptoms and has been on at least 3 different antibiotics without improvement. She called her Gyn earlier today and they recommended she be seen to rule out retained products. She also has a history of endometriosis.    Home Medications Prior to Admission medications   Medication Sig Start Date End Date Taking? Authorizing Provider  albuterol (VENTOLIN HFA) 108 (90 Base) MCG/ACT inhaler Inhale 2 puffs into the lungs every 6 (six) hours as needed for wheezing. 08/27/21   Christen Butter, NP  azelastine (ASTELIN) 0.1 % nasal spray Place 2 sprays into both nostrils 2 (two) times daily. Use in each nostril as directed 10/09/21   Christen Butter, NP  ibuprofen (ADVIL,MOTRIN) 200 MG tablet Take 600 mg by mouth every 6 (six) hours as needed for mild pain.    [provider]  ondansetron (ZOFRAN-ODT) 8 MG disintegrating tablet Take 1 tablet (8 mg total) by mouth every 8 (eight) hours as needed for nausea. 01/08/22   Christen Butter, NP  rizatriptan (MAXALT-MLT) 5 MG disintegrating tablet Take 1 tablet (5 mg total) by mouth as needed for migraine. May repeat in 2 hours if needed 01/08/22   Christen Butter, NP  topiramate (TOPAMAX) 25 MG tablet Take 1 tablet (25 mg total) by mouth 2 (two) times daily. 08/26/22   Christen Butter, NP  valACYclovir (VALTREX) 500 MG tablet Take 1 tablet (500 mg total) by  mouth 2 (two) times daily as needed. 08/26/22   Christen Butter, NP  venlafaxine XR (EFFEXOR XR) 75 MG 24 hr capsule Take 1 capsule (75 mg total) by mouth daily with breakfast. 04/25/22   Christen Butter, NP     Allergies    Patient has no known allergies.   Review of Systems   Review of Systems Please see HPI for pertinent positives and negatives  Physical Exam BP 136/83   Pulse 84   Temp 98.1 F (36.7 C) (Oral)   Resp 18   Ht 5\' 3"  (1.6 m)   Wt 101.9 kg   SpO2 100%   BMI 39.79 kg/m   Physical Exam Vitals and nursing note reviewed.  Constitutional:      Appearance: Normal appearance.  HENT:     Head: Normocephalic and atraumatic.     Nose: Nose normal.     Mouth/Throat:     Mouth: Mucous membranes are moist.  Eyes:     Extraocular Movements: Extraocular movements intact.     Conjunctiva/sclera: Conjunctivae normal.  Cardiovascular:     Rate and Rhythm: Normal rate.  Pulmonary:     Effort: Pulmonary effort is normal.     Breath sounds: Normal breath sounds.  Abdominal:     General: Abdomen is flat.     Palpations: Abdomen is soft.     Tenderness: There is no abdominal tenderness.  Musculoskeletal:  General: No swelling. Normal range of motion.     Cervical back: Neck supple.  Skin:    General: Skin is warm and dry.  Neurological:     General: No focal deficit present.     Mental Status: She is alert.  Psychiatric:        Mood and Affect: Mood normal.     ED Results / Procedures / Treatments   EKG None  Procedures Procedures  Medications Ordered in the ED Medications - No data to display  Initial Impression and Plan  Patient here with a variety of complaints, no obvious diagnosis to explain all of them. Her exam is benign, vitals are normal. Labs done in triage show CBC with mild leukocytosis, no anemia, CMP, lipase are normal. UA is clear. HCG is neg. Given recommendations from Gyn will send for [place] to rule out retained POC although this is unlikely  given the duration of her symptoms. If negative anticipate discharge with outpatient follow up.   ED Course   Clinical Course as of 09/17/22 0203  Tue Sep 17, 2022  0141 I personally viewed the images from radiology studies and agree with radiologist interpretation: [place] is neg for acute process. Plan discharge with continued outpatient management [CS]    Clinical Course User Index [CS] Truddie Hidden, MD     MDM Rules/Calculators/A&P Medical Decision Making Problems Addressed: Chills: chronic illness or injury  Amount and/or Complexity of Data Reviewed Labs: ordered. Decision-making details documented in ED Course. Radiology: ordered and independent interpretation performed. Decision-making details documented in ED Course.    Final Clinical Impression(s) / ED Diagnoses Final diagnoses:  Chills    Rx / DC Orders ED Discharge Orders     None        Truddie Hidden, MD 09/17/22 [phone]

## 2022-09-18 ENCOUNTER — Telehealth: Payer: Self-pay

## 2022-09-18 LAB — TSH: TSH: 0.917 u[IU]/mL (ref 0.450–4.500)

## 2022-09-18 LAB — T4, FREE: Free T4: 1.33 ng/dL (ref 0.82–1.77)

## 2022-09-18 NOTE — Telephone Encounter (Signed)
Noted and aware.

## 2022-09-18 NOTE — Telephone Encounter (Signed)
Per insurance -  no auth or pre-certification is required for CT study. Imaging dept notified to contact patient for scheduling. Ref # [id number].

## 2022-09-19 ENCOUNTER — Encounter: Payer: Self-pay | Admitting: Obstetrics and Gynecology

## 2022-09-19 ENCOUNTER — Other Ambulatory Visit (HOSPITAL_COMMUNITY)
Admission: RE | Admit: 2022-09-19 | Discharge: 2022-09-19 | Disposition: A | Discharge status: Home / Self Care | Payer: 59 | Source: Ambulatory Visit | Attending: Obstetrics and Gynecology | Admitting: Obstetrics and Gynecology

## 2022-09-19 ENCOUNTER — Ambulatory Visit (INDEPENDENT_AMBULATORY_CARE_PROVIDER_SITE_OTHER): Payer: 59 | Admitting: Obstetrics and Gynecology

## 2022-09-19 VITALS — BP 124/84 | HR 87 | Temp 98.4°F | Resp 16 | Ht 63.0 in | Wt 225.0 lb

## 2022-09-19 DIAGNOSIS — Z975 Presence of (intrauterine) contraceptive device: Secondary | ICD-10-CM | POA: Insufficient documentation

## 2022-09-19 DIAGNOSIS — R5381 Other malaise: Secondary | ICD-10-CM

## 2022-09-19 DIAGNOSIS — B3731 Acute candidiasis of vulva and vagina: Secondary | ICD-10-CM | POA: Diagnosis not present

## 2022-09-19 DIAGNOSIS — N921 Excessive and frequent menstruation with irregular cycle: Secondary | ICD-10-CM

## 2022-09-20 LAB — CERVICOVAGINAL ANCILLARY ONLY
Bacterial Vaginitis (gardnerella): NEGATIVE
Candida Glabrata: NEGATIVE
Candida Vaginitis: POSITIVE — AB
Chlamydia: NEGATIVE
Comment: NEGATIVE
Comment: NEGATIVE
Comment: NEGATIVE
Comment: NEGATIVE
Comment: NEGATIVE
Comment: NORMAL
Neisseria Gonorrhea: NEGATIVE
Trichomonas: NEGATIVE

## 2022-09-23 ENCOUNTER — Other Ambulatory Visit (HOSPITAL_BASED_OUTPATIENT_CLINIC_OR_DEPARTMENT_OTHER): Payer: Self-pay

## 2022-09-23 ENCOUNTER — Ambulatory Visit (INDEPENDENT_AMBULATORY_CARE_PROVIDER_SITE_OTHER): Payer: 59

## 2022-09-23 DIAGNOSIS — J329 Chronic sinusitis, unspecified: Secondary | ICD-10-CM

## 2022-09-23 MED ORDER — IOHEXOL 300 MG/ML  SOLN: 100.0000 mL | Freq: Once | INTRAMUSCULAR | Status: DC | PRN

## 2022-09-23 MED ORDER — IOHEXOL 300 MG/ML  SOLN
100.0000 mL | Freq: Once | INTRAMUSCULAR | Status: AC | PRN
  Administered 2022-09-23: 75 mL via INTRAVENOUS

## 2022-09-23 MED ORDER — FLUCONAZOLE 150 MG PO TABS
150.0000 mg | ORAL_TABLET | ORAL | 3 refills | Status: DC
  Filled 2022-09-23: qty 3, 9d supply, fill #0
  Filled 2022-11-15: qty 3, 9d supply, fill #1
  Filled 2023-01-22: qty 3, 9d supply, fill #2
  Filled 2023-02-26: qty 3, 9d supply, fill #3

## 2022-09-23 NOTE — Addendum Note (Signed)
Addended by: Milas Hock A on: 09/23/2022 05:01 PM   Modules accepted: Orders

## 2022-09-24 ENCOUNTER — Encounter: Payer: Self-pay | Admitting: Family Medicine

## 2022-10-21 ENCOUNTER — Other Ambulatory Visit (HOSPITAL_BASED_OUTPATIENT_CLINIC_OR_DEPARTMENT_OTHER): Payer: Self-pay

## 2022-11-15 ENCOUNTER — Other Ambulatory Visit: Payer: Self-pay | Admitting: Medical-Surgical

## 2022-11-15 ENCOUNTER — Other Ambulatory Visit (HOSPITAL_BASED_OUTPATIENT_CLINIC_OR_DEPARTMENT_OTHER): Payer: Self-pay

## 2022-11-15 ENCOUNTER — Other Ambulatory Visit: Payer: Self-pay

## 2022-11-15 DIAGNOSIS — F419 Anxiety disorder, unspecified: Secondary | ICD-10-CM

## 2022-11-15 MED ORDER — VENLAFAXINE HCL ER 75 MG PO CP24
75.0000 mg | ORAL_CAPSULE | Freq: Every day | ORAL | 0 refills | Status: DC
  Filled 2022-11-15: qty 90, 90d supply, fill #0

## 2022-11-25 ENCOUNTER — Encounter: Payer: Self-pay | Admitting: Medical-Surgical

## 2022-11-27 ENCOUNTER — Encounter: Payer: Self-pay | Admitting: Physician Assistant

## 2022-11-27 ENCOUNTER — Ambulatory Visit (INDEPENDENT_AMBULATORY_CARE_PROVIDER_SITE_OTHER): Payer: 59 | Admitting: Physician Assistant

## 2022-11-27 VITALS — BP 118/63 | HR 84 | Ht 63.0 in | Wt 233.1 lb

## 2022-11-27 DIAGNOSIS — J324 Chronic pansinusitis: Secondary | ICD-10-CM

## 2022-11-27 DIAGNOSIS — J309 Allergic rhinitis, unspecified: Secondary | ICD-10-CM | POA: Diagnosis not present

## 2022-11-27 DIAGNOSIS — R110 Nausea: Secondary | ICD-10-CM | POA: Insufficient documentation

## 2022-11-27 MED ORDER — DOXYCYCLINE HYCLATE 100 MG PO TABS: 100.0000 mg | ORAL_TABLET | Freq: Two times a day (BID) | ORAL | 0 refills | Status: DC

## 2022-11-27 MED ORDER — ONDANSETRON 8 MG PO TBDP: 8.0000 mg | ORAL_TABLET | Freq: Three times a day (TID) | ORAL | 3 refills | Status: DC | PRN

## 2022-11-27 MED ORDER — METHYLPREDNISOLONE 4 MG PO TBPK: ORAL_TABLET | ORAL | 0 refills | Status: DC

## 2022-11-27 NOTE — Progress Notes (Signed)
Acute Office Visit  Subjective:     Patient ID: Beth Holmes, female    DOB: Aug 03, 2001, 22 y.o.   MRN: [id number]  Chief Complaint  Patient presents with   Headache   Sinus Problem    HPI Patient is in today for follow-up on chronic sinusitis and allergic rhinitis.  Patient has a history of recurrent sinusitis however it has changed in the last 2 to 3 months where it has been persistent sinusitis symptoms.  She was seen by her PCP in November and given an antibiotic and steroid.  She never got better from this.  She was sent to ENT and they had suggested a nasal steroid saline wash.  This was $130 and she could not afford it.  She has not went back.  She then was seen in urgent care and a CT of her maxillofacial was ordered and showed trace mucosal thickening in ethmoid, frontal, maxillary sinuses.  She was told to take Allegra or Claritin-D and start the nasal wash.  She is still not been able to afford the nasal wash.  Her sinus drainage will cause her to cough so bad that she vomits.  Also cause her to be nausea and she requests a refill of Zofran.  She has lots of sinus pressure and headache.  Her eyes ache.  She denies any fever, chills or bodyaches.  She is taking regular Sudafed and using Flonase over-the-counter with some prescription as Astelin.  .. 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     Objective:    BP 118/63 (BP Location: Left Arm, Patient Position: Sitting, Cuff Size: Large)   Pulse 84   Ht 5' 3"$  (1.6 m)   Wt 233 lb 1.3 oz (105.7 kg)   SpO2 98%   BMI 41.29 kg/m  BP Readings from Last 3 Encounters:  11/27/22 118/63  09/19/22 124/84  09/17/22 112/74   Wt Readings from Last 3 Encounters:  11/27/22 233 lb 1.3 oz (105.7 kg)  09/19/22 225 lb (102.1 kg)  09/17/22 225 lb 5 oz (102.2 kg)      Physical Exam Constitutional:      Appearance: Normal appearance. She is obese.  HENT:     Head: Normocephalic.     Comments: Tenderness to palpation frontal and maxillary sinuses.     Right Ear: Tympanic membrane, ear canal and external ear normal. There is no impacted cerumen.     Left Ear: Tympanic membrane, ear canal and external ear normal. There is no impacted cerumen.     Nose: Congestion present.     Comments: Swollen and erythematous nasal turbinates, bilaterally.  Eyes:     Conjunctiva/sclera: Conjunctivae normal.  Cardiovascular:     Rate and Rhythm: Normal rate and regular rhythm.  Pulmonary:     Effort: Pulmonary effort is normal.     Breath sounds: Normal breath sounds.  Musculoskeletal:     Cervical back: Normal range of motion and neck supple. No tenderness.     Right lower leg: No edema.     Left lower leg: No edema.  Lymphadenopathy:     Cervical: No cervical adenopathy.  Neurological:     General: No focal deficit present.     Mental Status: She is alert and oriented to person, place, and time.  Psychiatric:        Mood and Affect: Mood normal.         Assessment & Plan:  Marland KitchenMarland KitchenGalit was seen today for headache and sinus problem.  Diagnoses and all orders for this visit:  Chronic pansinusitis -     doxycycline (VIBRA-TABS) 100 MG tablet;  Take 1 tablet (100 mg total) by mouth 2 (two) times daily. -     methylPREDNISolone (MEDROL DOSEPAK) 4 MG TBPK tablet; Take as directed by package insert.  Nausea -     ondansetron (ZOFRAN-ODT) 8 MG disintegrating tablet; Take 1 tablet (8 mg total) by mouth every 8 (eight) hours as needed for nausea.  Chronic allergic rhinitis -     methylPREDNISolone (MEDROL DOSEPAK) 4 MG TBPK tablet; Take as directed by package insert.   Reviewed CT scan of sinuses Treated with doxycycline and Medrol Dosepak.  Encourage patient to follow-up with ENT and asked them about how to afford this nasal steroid saline rinse.  I do think she should try switching up her antihistamine to possibly Zyrtec daily or Allegra-D daily.  She should continue Flonase and as Astelin.  She could stop the Flonase once she starts the nasal steroid sinus rinse.  Zofran was given for nausea to use as needed.  Iran Planas, PA-C

## 2022-11-27 NOTE — Patient Instructions (Signed)
Start zyrtec D daily  Medrol dose pack and doxycycline  Call about sinus rinse  Sinus Infection, Adult A sinus infection, also called sinusitis, is inflammation of your sinuses. Sinuses are hollow spaces in the bones around your face. Your sinuses are located: Around your eyes. In the middle of your forehead. Behind your nose. In your cheekbones. Mucus normally drains out of your sinuses. When your nasal tissues become inflamed or swollen, mucus can become trapped or blocked. This allows bacteria, viruses, and fungi to grow, which leads to infection. Most infections of the sinuses are caused by a virus. A sinus infection can develop quickly. It can last for up to 4 weeks (acute) or for more than 12 weeks (chronic). A sinus infection often develops after a cold. What are the causes? This condition is caused by anything that creates swelling in the sinuses or stops mucus from draining. This includes: Allergies. Asthma. Infection from bacteria or viruses. Deformities or blockages in your nose or sinuses. Abnormal growths in the nose (nasal polyps). Pollutants, such as chemicals or irritants in the air. Infection from fungi. This is rare. What increases the risk? You are more likely to develop this condition if you: Have a weak body defense system (immune system). Do a lot of swimming or diving. Overuse nasal sprays. Smoke. What are the signs or symptoms? The main symptoms of this condition are pain and a feeling of pressure around the affected sinuses. Other symptoms include: Stuffy nose or congestion that makes it difficult to breathe through your nose. Thick yellow or greenish drainage from your nose. Tenderness, swelling, and warmth over the affected sinuses. A cough that may get worse at night. Decreased sense of smell and taste. Extra mucus that collects in the throat or the back of the nose (postnasal drip) causing a sore throat or bad breath. Tiredness (fatigue). Fever. How  is this diagnosed? This condition is diagnosed based on: Your symptoms. Your medical history. A physical exam. Tests to find out if your condition is acute or chronic. This may include: Checking your nose for nasal polyps. Viewing your sinuses using a device that has a light (endoscope). Testing for allergies or bacteria. Imaging tests, such as an MRI or CT scan. In rare cases, a bone biopsy may be done to rule out more serious types of fungal sinus disease. How is this treated? Treatment for a sinus infection depends on the cause and whether your condition is chronic or acute. If caused by a virus, your symptoms should go away on their own within 10 days. You may be given medicines to relieve symptoms. They include: Medicines that shrink swollen nasal passages (decongestants). A spray that eases inflammation of the nostrils (topical intranasal corticosteroids). Rinses that help get rid of thick mucus in your nose (nasal saline washes). Medicines that treat allergies (antihistamines). Over-the-counter pain relievers. If caused by bacteria, your health care provider may recommend waiting to see if your symptoms improve. Most bacterial infections will get better without antibiotic medicine. You may be given antibiotics if you have: A severe infection. A weak immune system. If caused by narrow nasal passages or nasal polyps, surgery may be needed. Follow these instructions at home: Medicines Take, use, or apply over-the-counter and prescription medicines only as told by your health care provider. These may include nasal sprays. If you were prescribed an antibiotic medicine, take it as told by your health care provider. Do not stop taking the antibiotic even if you start to feel better. Hydrate  and humidify  Drink enough fluid to keep your urine pale yellow. Staying hydrated will help to thin your mucus. Use a cool mist humidifier to keep the humidity level in your home above 50%. Inhale  steam for 10-15 minutes, 3-4 times a day, or as told by your health care provider. You can do this in the bathroom while a hot shower is running. Limit your exposure to cool or dry air. Rest Rest as much as possible. Sleep with your head raised (elevated). Make sure you get enough sleep each night. General instructions  Apply a warm, moist washcloth to your face 3-4 times a day or as told by your health care provider. This will help with discomfort. Use nasal saline washes as often as told by your health care provider. Wash your hands often with soap and water to reduce your exposure to germs. If soap and water are not available, use hand sanitizer. Do not smoke. Avoid being around people who are smoking (secondhand smoke). Keep all follow-up visits. This is important. Contact a health care provider if: You have a fever. Your symptoms get worse. Your symptoms do not improve within 10 days. Get help right away if: You have a severe headache. You have persistent vomiting. You have severe pain or swelling around your face or eyes. You have vision problems. You develop confusion. Your neck is stiff. You have trouble breathing. These symptoms may be an emergency. Get help right away. Call 911. Do not wait to see if the symptoms will go away. Do not drive yourself to the hospital. Summary A sinus infection is soreness and inflammation of your sinuses. Sinuses are hollow spaces in the bones around your face. This condition is caused by nasal tissues that become inflamed or swollen. The swelling traps or blocks the flow of mucus. This allows bacteria, viruses, and fungi to grow, which leads to infection. If you were prescribed an antibiotic medicine, take it as told by your health care provider. Do not stop taking the antibiotic even if you start to feel better. Keep all follow-up visits. This is important. This information is not intended to replace advice given to you by your health care  provider. Make sure you discuss any questions you have with your health care provider. Document Revised: 09/04/2021 Document Reviewed: 09/04/2021 Elsevier Patient Education  Beth Holmes.

## 2022-12-02 ENCOUNTER — Other Ambulatory Visit (HOSPITAL_BASED_OUTPATIENT_CLINIC_OR_DEPARTMENT_OTHER): Payer: Self-pay

## 2022-12-11 ENCOUNTER — Ambulatory Visit: Payer: 59 | Admitting: Physician Assistant

## 2022-12-13 ENCOUNTER — Encounter: Payer: Self-pay | Admitting: Physician Assistant

## 2022-12-13 ENCOUNTER — Ambulatory Visit (INDEPENDENT_AMBULATORY_CARE_PROVIDER_SITE_OTHER): Payer: 59

## 2022-12-13 ENCOUNTER — Ambulatory Visit (INDEPENDENT_AMBULATORY_CARE_PROVIDER_SITE_OTHER): Payer: 59 | Admitting: Physician Assistant

## 2022-12-13 VITALS — BP 134/71 | HR 98 | Temp 98.1°F | Ht 63.0 in | Wt 225.0 lb

## 2022-12-13 DIAGNOSIS — R1111 Vomiting without nausea: Secondary | ICD-10-CM | POA: Diagnosis not present

## 2022-12-13 DIAGNOSIS — R053 Chronic cough: Secondary | ICD-10-CM

## 2022-12-13 DIAGNOSIS — R300 Dysuria: Secondary | ICD-10-CM

## 2022-12-13 DIAGNOSIS — R1013 Epigastric pain: Secondary | ICD-10-CM

## 2022-12-13 DIAGNOSIS — M549 Dorsalgia, unspecified: Secondary | ICD-10-CM

## 2022-12-13 DIAGNOSIS — J324 Chronic pansinusitis: Secondary | ICD-10-CM

## 2022-12-13 DIAGNOSIS — R634 Abnormal weight loss: Secondary | ICD-10-CM

## 2022-12-13 DIAGNOSIS — R051 Acute cough: Secondary | ICD-10-CM

## 2022-12-13 LAB — POCT URINE PREGNANCY: Preg Test, Ur: NEGATIVE

## 2022-12-13 MED ORDER — PANTOPRAZOLE SODIUM 40 MG PO TBEC: 40.0000 mg | DELAYED_RELEASE_TABLET | Freq: Two times a day (BID) | ORAL | 1 refills | Status: DC

## 2022-12-13 MED ORDER — HYOSCYAMINE SULFATE 0.125 MG PO TBDP
0.1250 mg | ORAL_TABLET | Freq: Once | ORAL | Status: AC
  Administered 2022-12-13: 0.125 mg via SUBLINGUAL

## 2022-12-13 MED ORDER — ALUM & MAG HYDROXIDE-SIMETH 200-200-20 MG/5ML PO SUSP
30.0000 mL | Freq: Once | ORAL | Status: AC
  Administered 2022-12-13: 30 mL via ORAL

## 2022-12-13 MED ORDER — LIDOCAINE VISCOUS HCL 2 % MT SOLN
15.0000 mL | Freq: Once | OROMUCOSAL | Status: AC
  Administered 2022-12-13: 15 mL via OROMUCOSAL

## 2022-12-13 NOTE — Progress Notes (Signed)
Acute Office Visit  Subjective:     Patient ID: Beth Holmes, female    DOB: 15-May-2001, 22 y.o.   MRN: [id number]  Chief Complaint  Patient presents with   Chest Pain    HPI Patient is in today for chest pain, cough, and shortness of breath daily for the past three weeks but she has not felt 100 percent since November of last year. She had an episode of chest pain a few days ago that she describes as sharp pain radiating to her back with numbness down her left arm. She took two aspirin and this resolved quickly. She has had intermittent back pain in between her shoulder blades. She had lipase checked 09/2022 and normal. She has tried ibuprofen for this with no relief. She has been coughing up "white and foamy" sputum. She coughs so hard that it makes her throw up, urinate or defecate on herself. She feels short of breath and feels like she is "drowning". Also, she mentions having watery diarrhea since Monday. Has not been eating or drinking much. Has lost 8 pounds since her last visit on 2/14. No fever or chills.   She has a history of chronic sinusitis since November and has been on several antibiotics including augmentin, amoxicllin, doxycycline, and 5+ steroid packs. She is still on a medrol dosepak from urgent care last week. She denies any congestion, sore throat, or sinus pain today.   She is having some dysuria for a few days. She has taken Azo and that has helped her symptoms.   . 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 Negative for chills and fever.  HENT:  Negative for congestion, sinus pain and sore throat.   Respiratory:  Positive for cough, sputum production and shortness of breath.   Cardiovascular:  Positive for chest pain and palpitations.  Gastrointestinal:  Positive for diarrhea and vomiting.  Genitourinary:  Positive for dysuria.  Musculoskeletal:  Positive for back pain.        Objective:    BP 134/71   Pulse 98   Temp 98.1 F (36.7 C) (Oral)   Ht '5\' 3"'$  (1.6 m)   Wt 102.1 kg   SpO2 100%   BMI 39.86 kg/m  BP Readings from Last 3 Encounters:  12/13/22 134/71  11/27/22 118/63  09/19/22 124/84   Wt Readings from Last 3 Encounters:  12/13/22 225 lb (102.1 kg)  11/27/22 233 lb 1.3 oz (105.7 kg)  09/19/22 225 lb (102.1 kg)      Physical Exam Constitutional:      Appearance: Normal appearance.  HENT:  Head: Normocephalic and atraumatic.  Eyes:     Extraocular Movements: Extraocular movements intact.  Cardiovascular:     Rate and Rhythm: Normal rate and regular rhythm.  Pulmonary:     Effort: Pulmonary effort is normal.     Breath sounds: Normal breath sounds.     Comments: Hacking productive cough while in exam room even to point of vomiting.  Chest:     Chest wall: No tenderness.  Abdominal:     General: Abdomen is flat. There is no distension.     Palpations: Abdomen is soft. There is no mass.     Comments: Epigastric tenderness   Musculoskeletal:        General: Normal range of motion.  Skin:    Coloration: Skin is pale.  Neurological:     Mental Status: She is alert and oriented to person,  place, and time.  Psychiatric:        Mood and Affect: Mood normal.     Results for orders placed or performed in visit on 12/13/22  POCT urine pregnancy  Result Value Ref Range   Preg Test, Ur Negative Negative        Assessment & Plan:   Beth Holmes was seen today for chest pain.  Diagnoses and all orders for this visit:  Chronic cough -     DG Chest 2 View; Future -     H. pylori breath test -     COMPLETE METABOLIC PANEL WITH GFR -     CBC w/Diff/Platelet  Vomiting without nausea, unspecified vomiting type -     H. pylori breath test -     COMPLETE METABOLIC PANEL WITH GFR -     CBC w/Diff/Platelet -     POCT urine pregnancy -     alum & mag hydroxide-simeth (MAALOX/MYLANTA) 200-200-20 MG/5ML suspension 30 mL -     hyoscyamine (ANASPAZ) disintergrating tablet 0.125 mg -     lidocaine (XYLOCAINE) 2 % viscous mouth solution 15 mL  Dysuria -     Urine Culture  Other orders -     pantoprazole (PROTONIX) 40 MG tablet; Take 1 tablet (40 mg total) by mouth 2 (two) times daily before a meal.   Chronic cough  Ordered CXR   2. Vomiting without nausea  Urine pregnancy test negative in office Ordered CBC and CMP   Ordered H. Pylori breath test  Gave GI cocktail  Start Protonix 40 mg BID   3. Dysuria  Ordered urine culture  Continue AZO as needed   Unclear etiology today of symptoms Vital signs are reassuring Will get urine culture to assess for any UTI No CVA tenderness to suspect pyleonephritis Cough is productive and aggressive No signs of bacterial infection and has been treated with 3 rounds of abx Will get CXR today UPT negative Concerned for some GERD/Gastritis causing symptoms Will get CBC and CMP as well as H.pylori breath test Lipase was normal in December and no significant abdominal tenderness GI cocktail given in office today and protonix started bid  Let me know Monday how you are d  Follow up as needed if symptoms persist  Spent 45 minutes  reviewing chart and past treatment plans as well as ordering and coordinating current treatment plan.

## 2022-12-13 NOTE — Patient Instructions (Signed)
Get labs and h.pylori testing Come back for GI cocktail Get CxR

## 2022-12-13 NOTE — Progress Notes (Signed)
Your chest xray is completely normal. Great news.

## 2022-12-14 LAB — URINE CULTURE
MICRO NUMBER:: 14638034
SPECIMEN QUALITY:: ADEQUATE

## 2022-12-16 ENCOUNTER — Ambulatory Visit (INDEPENDENT_AMBULATORY_CARE_PROVIDER_SITE_OTHER): Payer: 59 | Admitting: Physician Assistant

## 2022-12-16 ENCOUNTER — Ambulatory Visit: Payer: 59 | Admitting: Physician Assistant

## 2022-12-16 ENCOUNTER — Ambulatory Visit (INDEPENDENT_AMBULATORY_CARE_PROVIDER_SITE_OTHER): Payer: 59

## 2022-12-16 VITALS — BP 113/61 | HR 66 | Temp 98.3°F | Ht 63.0 in | Wt 225.0 lb

## 2022-12-16 DIAGNOSIS — R634 Abnormal weight loss: Secondary | ICD-10-CM

## 2022-12-16 DIAGNOSIS — R1013 Epigastric pain: Secondary | ICD-10-CM | POA: Diagnosis not present

## 2022-12-16 DIAGNOSIS — R112 Nausea with vomiting, unspecified: Secondary | ICD-10-CM

## 2022-12-16 DIAGNOSIS — M549 Dorsalgia, unspecified: Secondary | ICD-10-CM | POA: Diagnosis not present

## 2022-12-16 DIAGNOSIS — R197 Diarrhea, unspecified: Secondary | ICD-10-CM

## 2022-12-16 DIAGNOSIS — R590 Localized enlarged lymph nodes: Secondary | ICD-10-CM

## 2022-12-16 MED ORDER — IOHEXOL 300 MG/ML  SOLN
100.0000 mL | Freq: Once | INTRAMUSCULAR | Status: AC | PRN
  Administered 2022-12-16: 100 mL via INTRAVENOUS

## 2022-12-16 NOTE — Progress Notes (Signed)
No acute abnormality.  You do have some lymph nodes in the abdomen enlarged. Leaning more towards viral reason.  Will refer to GI, ASAP. Stay on protonix bid.

## 2022-12-16 NOTE — Progress Notes (Signed)
ALT, a liver enzyme, a little elevated.  No WBC elevation.  No urinary tract infection.  Did you feel any better after the GI cocktail.

## 2022-12-16 NOTE — Patient Instructions (Signed)
Get CT of abdomen.

## 2022-12-16 NOTE — Progress Notes (Unsigned)
Established Patient Office Visit  Subjective   Patient ID: Beth Holmes, female    DOB: [birth year]  Age: 22 y.o. MRN: [id number]  Chief Complaint  Patient presents with   Cough    HPI Pt is a 22 yo obese female who presents to the clinic with her grandmother to discuss ongoing cough and epigastric pain.   Patient was seen last week and had a normal chest x-ray.  Her white blood count was normal.  Her ALT was a little elevated.  She was tested for H. pylori but that is not resulted yet.  She did start Protonix twice a day and was given a GI cocktail in the office.  She does report not report any improvement but she does state that she has not vomited in the last 24 hours.  She continues to have runny and loose pale bowel movements.  She has epigastric tenderness and productive cough.   She states she is just tired of feeling bad.   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   Bladder pain 05/08/2020   Anxiety 05/08/2020   Obesity, unspecified 09/17/2007   Past Medical History:  Diagnosis Date   Anxiety    Asthma    Chronic pelvic pain in female    Depression    Ovarian cyst    rt side   Past Surgical History:  Procedure Laterality Date   ADENOIDECTOMY     OVARIAN CYST SURGERY     TONSILLECTOMY     UPPER GASTROINTESTINAL ENDOSCOPY  08/11/2020   Family History  Problem Relation Age of Onset   Thyroid disease Father    Lung cancer Father    Throat cancer Father    Bone cancer Father    Diabetes Maternal Grandmother    Hypertension Maternal Grandmother    Diabetes Maternal Grandfather     Hypertension Maternal Grandfather    Hypertension Paternal Grandmother    Colon cancer Paternal Grandmother    Hypertension Paternal Grandfather    Ovarian cancer Paternal Aunt    Crohn's disease Neg Hx    Inflammatory bowel disease Neg Hx    Food intolerance Neg Hx    Celiac disease Neg Hx    GER disease Neg Hx    Migraines Neg Hx    Esophageal cancer Neg Hx    Stomach cancer Neg Hx    Rectal cancer Neg Hx    No Known Allergies    ROS See HPI.    Objective:     BP 113/61   Pulse 66   Temp 98.3 F (36.8 C) (Oral)   Ht '5\' 3"'$  (1.6 m)   Wt 225 lb (102.1 kg)   SpO2 96%   BMI 39.86 kg/m  BP Readings from Last 3 Encounters:  12/16/22 113/61  12/13/22 134/71  11/27/22 118/63   Wt Readings from Last 3 Encounters:  12/16/22 225 lb (102.1 kg)  12/13/22 225 lb (102.1 kg)  11/27/22 233 lb 1.3 oz (105.7 kg)      Physical Exam Constitutional:      Appearance: Normal appearance. She is obese.  HENT:  Head: Normocephalic.  Neck:     Vascular: No carotid bruit.  Cardiovascular:     Rate and Rhythm: Normal rate and regular rhythm.     Pulses: Normal pulses.     Heart sounds: Normal heart sounds.  Pulmonary:     Effort: Pulmonary effort is normal.     Breath sounds: Normal breath sounds. No wheezing or rhonchi.     Comments: No active cough on exam today.  Abdominal:     General: Bowel sounds are normal. There is no distension.     Palpations: Abdomen is soft. There is no mass.     Tenderness: There is abdominal tenderness. There is no right CVA tenderness, left CVA tenderness, guarding or rebound.     Hernia: No hernia is present.     Comments: Epigastric tenderness to palpation. No murphys sign.   Musculoskeletal:     Cervical back: Normal range of motion and neck supple. No rigidity or tenderness.     Right lower leg: No edema.     Left lower leg: No edema.  Lymphadenopathy:     Cervical: No cervical adenopathy.  Neurological:     Mental Status: She is  alert and oriented to person, place, and time.  Psychiatric:        Mood and Affect: Mood normal.         Assessment & Plan:  Marland KitchenMarland KitchenKimie was seen today for cough.  Diagnoses and all orders for this visit:  Epigastric pain -     CT ABDOMEN PELVIS W CONTRAST; Future -     Ambulatory referral to Gastroenterology  Nausea and vomiting, unspecified vomiting type -     CT ABDOMEN PELVIS W CONTRAST; Future -     Ambulatory referral to Gastroenterology  Unintentional weight loss -     CT ABDOMEN PELVIS W CONTRAST; Future -     Ambulatory referral to Gastroenterology  Pain radiating to back -     CT ABDOMEN PELVIS W CONTRAST; Future -     Ambulatory referral to Gastroenterology  Diarrhea, unspecified type -     CT ABDOMEN PELVIS W CONTRAST; Future -     Ambulatory referral to Gastroenterology  Mesenteric lymphadenopathy -     Ambulatory referral to Gastroenterology   Vital signs appear great Despite patient reporting she is not feeling better she coughed much less than last visit and did not vomit at all. She has not vomited in 24 hours.  CXR normal.  Labs normal except slight elevation in ALT.  H.pyolri still in progress Stay on protonix Stat CT done today Will refer to GI.   Spent 43 minutes reviewing chart to include previous office visits, labs, imaging as well as discussing with patient and coordinating care for treatment plan today.    Iran Planas, PA-C

## 2022-12-17 ENCOUNTER — Ambulatory Visit (INDEPENDENT_AMBULATORY_CARE_PROVIDER_SITE_OTHER): Payer: 59

## 2022-12-17 ENCOUNTER — Encounter: Payer: Self-pay | Admitting: Physician Assistant

## 2022-12-17 ENCOUNTER — Telehealth: Payer: Self-pay | Admitting: Neurology

## 2022-12-17 DIAGNOSIS — M549 Dorsalgia, unspecified: Secondary | ICD-10-CM | POA: Diagnosis not present

## 2022-12-17 DIAGNOSIS — R051 Acute cough: Secondary | ICD-10-CM

## 2022-12-17 DIAGNOSIS — R1013 Epigastric pain: Secondary | ICD-10-CM | POA: Diagnosis not present

## 2022-12-17 DIAGNOSIS — R0602 Shortness of breath: Secondary | ICD-10-CM

## 2022-12-17 LAB — CBC WITH DIFFERENTIAL/PLATELET
Absolute Monocytes: 101 cells/uL — ABNORMAL LOW (ref 200–950)
Basophils Absolute: 12 cells/uL (ref 0–200)
Basophils Relative: 0.3 %
Eosinophils Absolute: 0 cells/uL — ABNORMAL LOW (ref 15–500)
Eosinophils Relative: 0 %
HCT: 40.5 % (ref 35.0–45.0)
Hemoglobin: 13.3 g/dL (ref 11.7–15.5)
Lymphs Abs: 593 cells/uL — ABNORMAL LOW (ref 850–3900)
MCH: 26.1 pg — ABNORMAL LOW (ref 27.0–33.0)
MCHC: 32.8 g/dL (ref 32.0–36.0)
MCV: 79.4 fL — ABNORMAL LOW (ref 80.0–100.0)
MPV: 10.5 fL (ref 7.5–12.5)
Monocytes Relative: 2.6 %
Neutro Abs: 3194 cells/uL (ref 1500–7800)
Neutrophils Relative %: 81.9 %
Platelets: 211 10*3/uL (ref 140–400)
RBC: 5.1 10*6/uL (ref 3.80–5.10)
RDW: 13.1 % (ref 11.0–15.0)
Total Lymphocyte: 15.2 %
WBC: 3.9 10*3/uL (ref 3.8–10.8)

## 2022-12-17 LAB — COMPLETE METABOLIC PANEL WITH GFR
AG Ratio: 1.5 (calc) (ref 1.0–2.5)
ALT: 40 U/L — ABNORMAL HIGH (ref 6–29)
AST: 23 U/L (ref 10–30)
Albumin: 4.3 g/dL (ref 3.6–5.1)
Alkaline phosphatase (APISO): 75 U/L (ref 31–125)
BUN: 8 mg/dL (ref 7–25)
CO2: 23 mmol/L (ref 20–32)
Calcium: 8.7 mg/dL (ref 8.6–10.2)
Chloride: 103 mmol/L (ref 98–110)
Creat: 0.68 mg/dL (ref 0.50–0.96)
Globulin: 2.8 g/dL (calc) (ref 1.9–3.7)
Glucose, Bld: 120 mg/dL — ABNORMAL HIGH (ref 65–99)
Potassium: 4 mmol/L (ref 3.5–5.3)
Sodium: 135 mmol/L (ref 135–146)
Total Bilirubin: 0.4 mg/dL (ref 0.2–1.2)
Total Protein: 7.1 g/dL (ref 6.1–8.1)
eGFR: 128 mL/min/{1.73_m2} (ref >=60)

## 2022-12-17 LAB — H. PYLORI BREATH TEST: H. pylori Breath Test: NOT DETECTED

## 2022-12-17 MED ORDER — IOHEXOL 350 MG/ML SOLN
200.0000 mL | Freq: Once | INTRAVENOUS | Status: AC | PRN
  Administered 2022-12-17: 200 mL via INTRAVENOUS

## 2022-12-17 MED ORDER — SUCRALFATE 1 G PO TABS: 1.0000 g | ORAL_TABLET | Freq: Three times a day (TID) | ORAL | 0 refills | Status: DC

## 2022-12-17 NOTE — Addendum Note (Signed)
Addended by: Donella Stade on: 12/17/2022 01:58 PM   Modules accepted: Orders

## 2022-12-17 NOTE — Progress Notes (Signed)
IMPRESSION: 1. No acute abnormality identified in the abdomen or pelvis. 2. Similar prominent retroperitoneal and mesenteric lymph nodes, nonspecific but possibly reactive. 3. Intrauterine device appears appropriate in position.   Possible reactive lymph nodes in the mesenteric gut but no acute abnormalities. Discussed with patient likely viral or bacterial infection of gut caused some of her symptoms. Since persistent Urgent GI referral was made. Continue protonix bid.

## 2022-12-17 NOTE — Telephone Encounter (Signed)
Per Luvenia Starch - she wants to have patient get CTA to r/o pulmonary embolism. Called patient and made her aware this was ordered and someone would be in contact after insurance approval to let her know next steps. Julious Oka.

## 2022-12-17 NOTE — Telephone Encounter (Signed)
Thank you. Patient was contacted after obtaining the auth for the CT study. Patient was provided the direct contact info for imaging for scheduling an appt. The imaging department was notified as well of the outcome and to reach out to the patient.

## 2022-12-17 NOTE — Progress Notes (Signed)
Negative for h.pylori. I am going to add some carafate to help with epigastric pain and pain when eating.

## 2022-12-17 NOTE — Progress Notes (Signed)
GREAT news! No blood clots. Lungs are clear.

## 2022-12-18 ENCOUNTER — Other Ambulatory Visit (HOSPITAL_BASED_OUTPATIENT_CLINIC_OR_DEPARTMENT_OTHER): Payer: Self-pay

## 2022-12-18 MED ORDER — TRAMADOL HCL 50 MG PO TABS
50.0000 mg | ORAL_TABLET | Freq: Three times a day (TID) | ORAL | 0 refills | Status: AC | PRN
  Filled 2022-12-18: qty 15, 5d supply, fill #0

## 2022-12-18 NOTE — Addendum Note (Signed)
Addended by: Donella Stade on: 12/18/2022 08:28 AM   Modules accepted: Orders

## 2022-12-19 ENCOUNTER — Encounter: Payer: Self-pay | Admitting: Medical-Surgical

## 2022-12-19 ENCOUNTER — Ambulatory Visit (INDEPENDENT_AMBULATORY_CARE_PROVIDER_SITE_OTHER): Payer: 59 | Admitting: Medical-Surgical

## 2022-12-19 VITALS — BP 131/81 | HR 94 | Resp 20 | Ht 63.0 in | Wt 228.1 lb

## 2022-12-19 DIAGNOSIS — M549 Dorsalgia, unspecified: Secondary | ICD-10-CM

## 2022-12-19 DIAGNOSIS — R634 Abnormal weight loss: Secondary | ICD-10-CM | POA: Diagnosis not present

## 2022-12-19 DIAGNOSIS — R1013 Epigastric pain: Secondary | ICD-10-CM

## 2022-12-19 DIAGNOSIS — G43909 Migraine, unspecified, not intractable, without status migrainosus: Secondary | ICD-10-CM

## 2022-12-19 DIAGNOSIS — R112 Nausea with vomiting, unspecified: Secondary | ICD-10-CM | POA: Diagnosis not present

## 2022-12-19 DIAGNOSIS — R053 Chronic cough: Secondary | ICD-10-CM

## 2022-12-19 DIAGNOSIS — R590 Localized enlarged lymph nodes: Secondary | ICD-10-CM

## 2022-12-19 NOTE — Progress Notes (Signed)
Established Patient Office Visit  Subjective   Patient ID: MALYNN NANNEY, female   DOB: 09/14/2001 Age: 22 y.o. MRN: [id number]   Chief Complaint  Patient presents with   Follow-up   HPI Pleasant 22 year old female presenting today to follow-up after seeing Iran Planas, PA and Dr. Huel Cote for multiple issues since November.  Reports that she started with sinus issues back in November that have lingered since then without resolution.  She has been treated with multiple antibiotics as well as multiple courses of steroids without resolution.  She was referred to ENT where they recommended a nasal steroid rinse.  She has been unable to afford this and does not feel like is going to make any difference after she has used steroid nasal sprays and done saline rinses before.  She did not return to ENT after that appointment.  Since then, she has also developed significant GI symptoms, back pain, frontal headache, and a persistent cough.  She has undergone a chest x-ray, a CT abdomen pelvis, a CT maxillofacial, and a CT angio chest.  No significant findings or abnormalities outside of some abdominal lymph node enlargement of 10 mm or less.  She has received a referral to GI and has an appointment coming up in a little over a week.  Reports that she never got in with neurology with the referral that was placed last year.  Had been taking ibuprofen and Tylenol but this was not helpful for her back pain.  She has tramadol on hand.  Also has been prescribed a PPI which she has been taking as instructed.  A couple of days ago, Carafate was sent in for her but she has not picked this up from the pharmacy.  Reports that she feels like there is something significantly wrong and is very scared due to a strong family history of cancer.  Notes that she does have episodes of chest pain accompanied by tingling of the hands and palpitations but when these episodes occur, she does not feel like she is anxious.    Objective:    Vitals:   12/19/22 1605  BP: 131/81  Pulse: 94  Resp: 20  Height: '5\' 3"'$  (1.6 m)  Weight: 228 lb 1.6 oz (103.5 kg)  SpO2: 96%  BMI (Calculated): 40.42    Physical Exam Vitals reviewed.  Constitutional:      General: She is not in acute distress.    Appearance: Normal appearance. She is obese. She is ill-appearing.  HENT:     Head: Normocephalic and atraumatic.  Cardiovascular:     Rate and Rhythm: Normal rate and regular rhythm.     Pulses: Normal pulses.     Heart sounds: Normal heart sounds.  Pulmonary:     Effort: Pulmonary effort is normal. No respiratory distress.     Breath sounds: Normal breath sounds. No wheezing, rhonchi or rales.  Skin:    General: Skin is warm and dry.  Neurological:     Mental Status: She is alert and oriented to person, place, and time.  Psychiatric:        Mood and Affect: Mood is anxious.        Behavior: Behavior normal.        Thought Content: Thought content normal.        Judgment: Judgment normal.      No results found for this or any previous visit (from the past 24 hour(s)).     The ASCVD Risk score (Arnett DK, et  al., 2019) failed to calculate for the following reasons:   The 2019 ASCVD risk score is only valid for ages 62 to 84   Assessment & Plan:   1. Migraine without status migrainosus, not intractable, unspecified migraine type Reentering referral to neurology.  Continue Topamax and Maxalt.  Avoid over-the-counter pain medications as much as possible to prevent rebound headaches.  Cautioned regarding the use of tramadol as this can also cause worsening or rebound headaches. - Ambulatory referral to Neurology  2. Epigastric pain 3. Nausea and vomiting, unspecified vomiting type 4. Unintentional weight loss 5. Pain radiating to back 6. Mesenteric lymphadenopathy 7. Chronic cough Unclear etiology.  She has had a comprehensive workup to the best of our capabilities and primary care.  Strongly encouraged  to continue with the appointment with GI for further evaluation.  Referring back to neurology to see if we can get better control of her headaches.  Suspect anxiety may play a role in the episodes of chest pain/palpitations/hand tingling even though she does not consciously feel anxious at the time.  Recommend continuing on prescribed medications but would like her to go by her pharmacy to inquire about Carafate as this may help with nausea and GI discomfort.  Time spent reviewing documentation to date as well as testing that has been completed.  No additional workup recommendations indicated at this time.  Provided reassurance that she has had imaging from her head to her pelvis and there have been no findings that are concerning for malignancy.  Return if symptoms worsen or fail to improve.  ___________________________________________ Clearnce Sorrel, DNP, APRN, FNP-BC Primary Care and [place]

## 2023-01-04 ENCOUNTER — Other Ambulatory Visit: Payer: Self-pay | Admitting: Physician Assistant

## 2023-01-04 DIAGNOSIS — R1013 Epigastric pain: Secondary | ICD-10-CM

## 2023-01-04 DIAGNOSIS — R053 Chronic cough: Secondary | ICD-10-CM

## 2023-01-04 DIAGNOSIS — R634 Abnormal weight loss: Secondary | ICD-10-CM

## 2023-01-04 DIAGNOSIS — R1111 Vomiting without nausea: Secondary | ICD-10-CM

## 2023-01-22 ENCOUNTER — Other Ambulatory Visit: Payer: Self-pay | Admitting: Physician Assistant

## 2023-01-22 ENCOUNTER — Other Ambulatory Visit: Payer: Self-pay

## 2023-01-22 ENCOUNTER — Other Ambulatory Visit (HOSPITAL_BASED_OUTPATIENT_CLINIC_OR_DEPARTMENT_OTHER): Payer: Self-pay

## 2023-01-22 DIAGNOSIS — R1111 Vomiting without nausea: Secondary | ICD-10-CM

## 2023-01-22 DIAGNOSIS — R634 Abnormal weight loss: Secondary | ICD-10-CM

## 2023-01-22 DIAGNOSIS — R053 Chronic cough: Secondary | ICD-10-CM

## 2023-01-22 DIAGNOSIS — R1013 Epigastric pain: Secondary | ICD-10-CM

## 2023-01-22 MED ORDER — PANTOPRAZOLE SODIUM 40 MG PO TBEC
40.0000 mg | DELAYED_RELEASE_TABLET | Freq: Two times a day (BID) | ORAL | 0 refills | Status: DC
Start: 2023-01-22 — End: 2023-05-28
  Filled 2023-01-22: qty 180, 90d supply, fill #0

## 2023-01-22 NOTE — Telephone Encounter (Signed)
Spoke with [facility] - prescription written on 01/06/23 was not picked up. Resent to  [facility].

## 2023-01-22 NOTE — Telephone Encounter (Signed)
In checking patient's chart. Has refill of pantoprazole sent on 01/06/23 to [facility]. Attempted to call pharmacy to see if medication was picked up but pharmacy was closed for lunch - will try again in about 30 min or an hour.

## 2023-01-28 LAB — HM COLONOSCOPY

## 2023-02-04 ENCOUNTER — Encounter: Payer: Self-pay | Admitting: Obstetrics and Gynecology

## 2023-02-05 ENCOUNTER — Ambulatory Visit (INDEPENDENT_AMBULATORY_CARE_PROVIDER_SITE_OTHER): Payer: 59 | Admitting: Obstetrics and Gynecology

## 2023-02-05 ENCOUNTER — Encounter: Payer: Self-pay | Admitting: Obstetrics and Gynecology

## 2023-02-05 VITALS — Ht 63.0 in | Wt 224.0 lb

## 2023-02-05 DIAGNOSIS — Z30431 Encounter for routine checking of intrauterine contraceptive device: Secondary | ICD-10-CM | POA: Diagnosis not present

## 2023-02-05 DIAGNOSIS — N809 Endometriosis, unspecified: Secondary | ICD-10-CM

## 2023-02-05 DIAGNOSIS — T8332XA Displacement of intrauterine contraceptive device, initial encounter: Secondary | ICD-10-CM | POA: Diagnosis not present

## 2023-02-05 NOTE — Progress Notes (Signed)
   RETURN GYNECOLOGY VISIT  Subjective:  Beth Holmes is a 22 y.o. G1P0010 with endometriosis & Mirena IUD in place since 04/30/21 presenting for IUD check up  Longstanding history of endometriosis (confirmed dx laparoscopically 2022), now reports worsening pelvic pain & cramping as well as vaginal pain. Reports recent intercourse with her partner where he could feel something poking/hurting him. Is worried her IUD is out.  No HVB.   Reports hx malpositioned/expelled IUD. Reports the strings were trimmed very short with this IUD.   I personally reviewed her operative note from 04/30/21 and her pathology report from 04/30/21 confirming endometriosis.  Objective:   Vitals:   02/05/23 1013  Weight: 224 lb (101.6 kg)  Height:  (1.6 m)    General:  Alert, oriented and cooperative. Patient is in no acute distress.  Skin: Skin is warm and dry. No rash noted.   Cardiovascular: Normal heart rate noted  Respiratory: Normal respiratory effort, no problems with respiration noted  Abdomen: Soft, non-tender, non-distended   Pelvic: NEFG. Cervix small & visually normal. IUD strings not visualized.  Exam performed in the presence of a chaperone  Assessment and Plan:  Beth Holmes is a 22 y.o. with endometriosis and worsening pelvic pain, concern for malpositioned IUD  Endometriosis Intrauterine contraceptive device threads lost, initial encounter Pt w/ hx chronic pelvic pain related to endometriosis, now with exacerbation/recurrence of her pain. Is worried the pain is related to her IUD IUD strings not visualized or palpable Will obtain pelvic US to r/o malpositioned IUD If IUD in place, would recommend continued use. If malpositioned, will discuss removal & alternative treatments for her pain/contraception -     US PELVIC COMPLETE WITH TRANSVAGINAL; Future  Return for pelvic ultrasound.  Future Appointments  Date Time Provider Department Center  02/06/2023  2:00 PM [facility] [place] [facility]  [facility]   Lennart Pall, MD

## 2023-02-06 ENCOUNTER — Other Ambulatory Visit: Payer: 59

## 2023-02-07 ENCOUNTER — Other Ambulatory Visit: Payer: Self-pay | Admitting: Medical-Surgical

## 2023-02-07 ENCOUNTER — Other Ambulatory Visit (HOSPITAL_BASED_OUTPATIENT_CLINIC_OR_DEPARTMENT_OTHER): Payer: Self-pay

## 2023-02-07 DIAGNOSIS — F419 Anxiety disorder, unspecified: Secondary | ICD-10-CM

## 2023-02-07 MED ORDER — VENLAFAXINE HCL ER 75 MG PO CP24
75.0000 mg | ORAL_CAPSULE | Freq: Every day | ORAL | 0 refills | Status: DC
Start: 2023-02-07 — End: 2023-05-01
  Filled 2023-02-07: qty 90, 90d supply, fill #0

## 2023-02-10 ENCOUNTER — Ambulatory Visit (INDEPENDENT_AMBULATORY_CARE_PROVIDER_SITE_OTHER): Payer: 59

## 2023-02-10 DIAGNOSIS — T8332XA Displacement of intrauterine contraceptive device, initial encounter: Secondary | ICD-10-CM | POA: Diagnosis not present

## 2023-02-11 IMAGING — US US TRANSVAGINAL NON-OB
1 series · 14 of 25 positions shown · non-contrast
Comparison: Pelvic ultrasound 01/19/2021

CLINICAL DATA: Vaginal bleeding, IUD

EXAM:
ULTRASOUND PELVIS TRANSVAGINAL
TECHNIQUE: Transvaginal ultrasound examination of the pelvis was performed
including evaluation of the uterus, ovaries, adnexal regions, and
pelvic cul-de-sac.

[Series 1: us pelvis transvaginal non-ob (tv only) · 49 acquisitions, 14 frames shown]
[im 1/49]
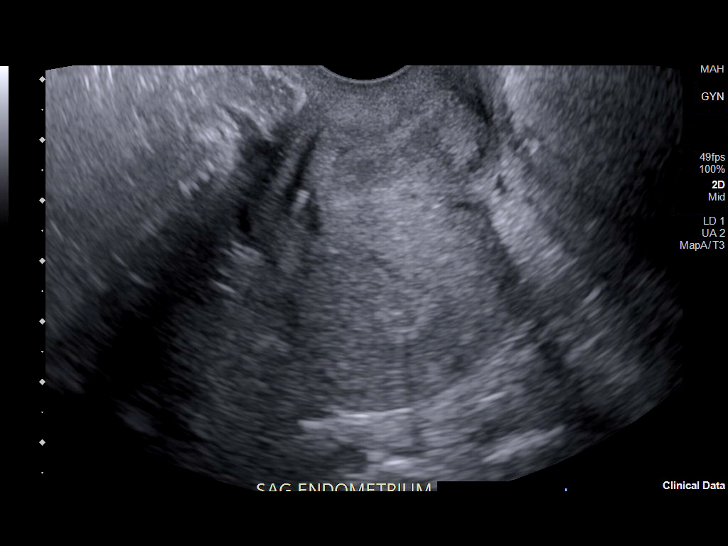
[im 5/49]
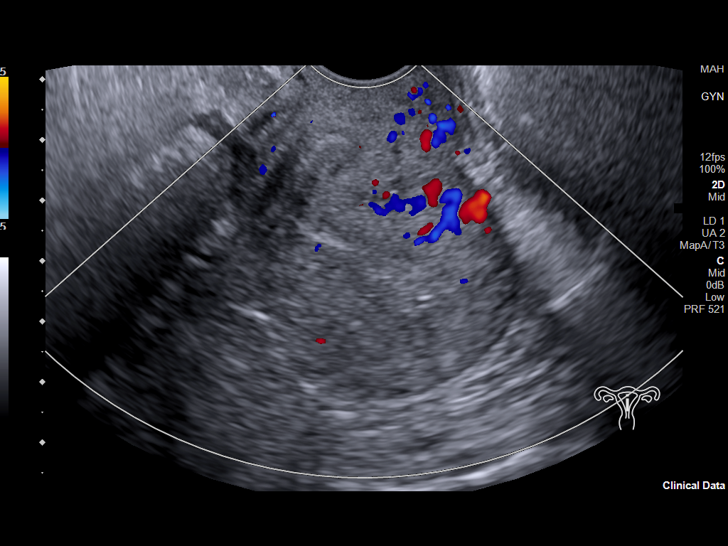
[im 9/49]
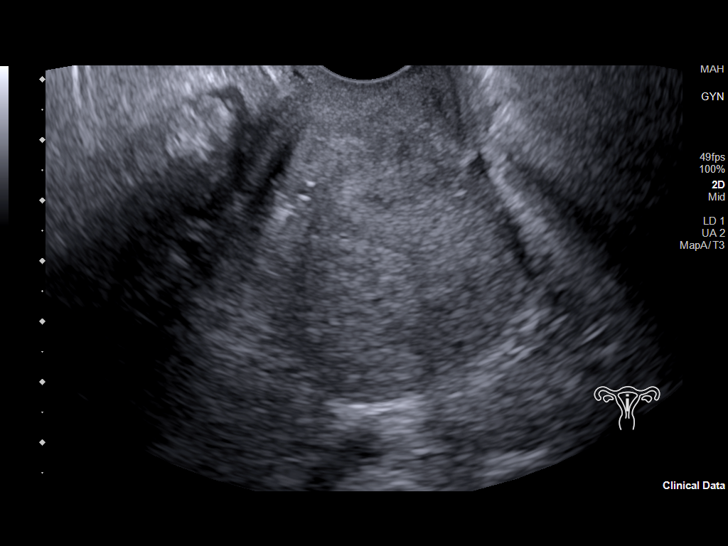
[im 13/49]
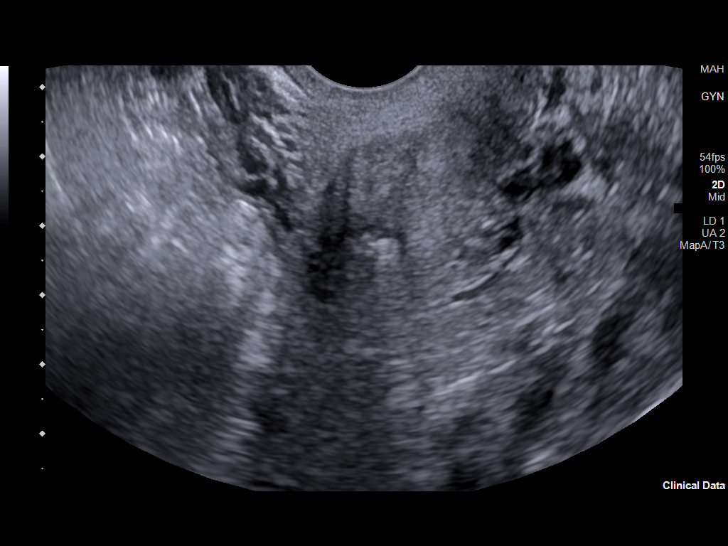
[im 17/49]
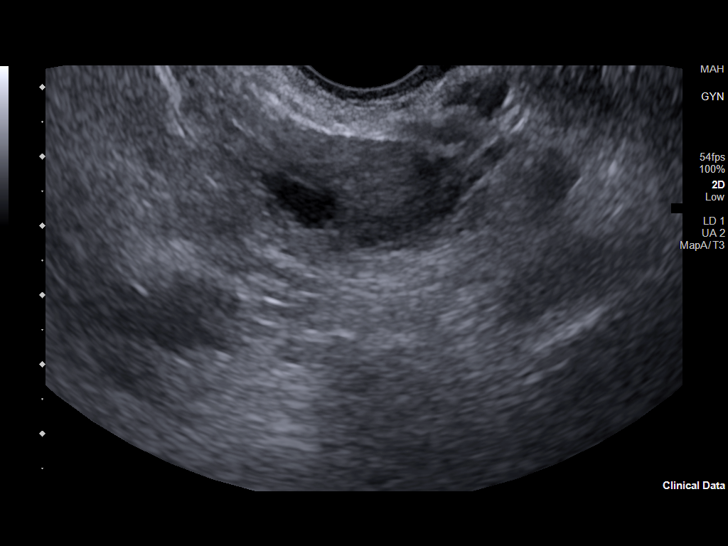
[im 19/49]
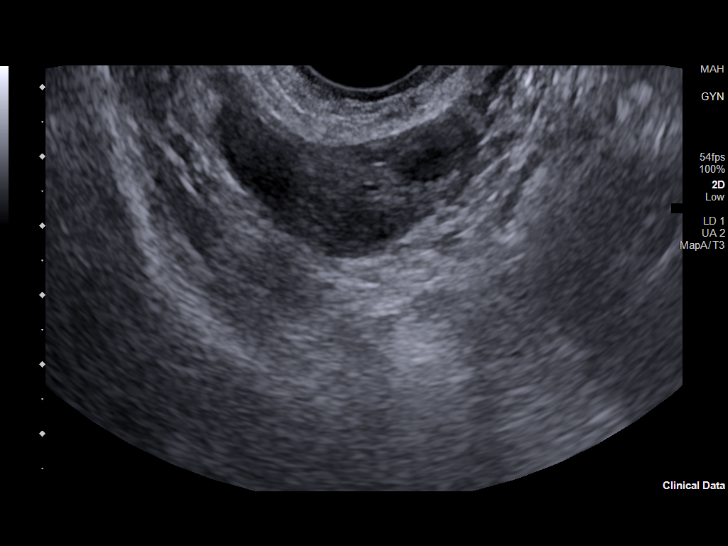
[im 23/49]
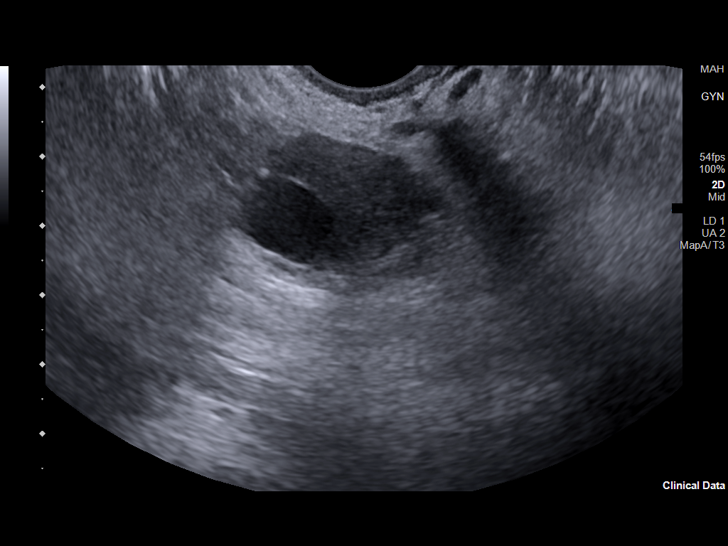
[im 27/49]
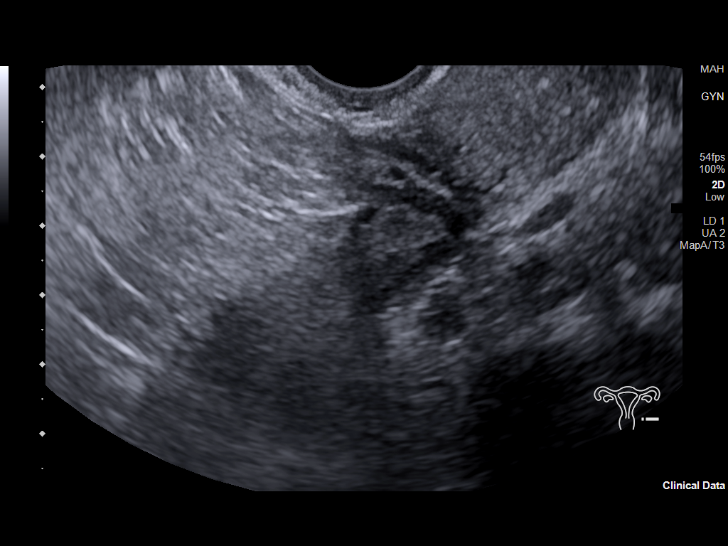
[im 31/49]
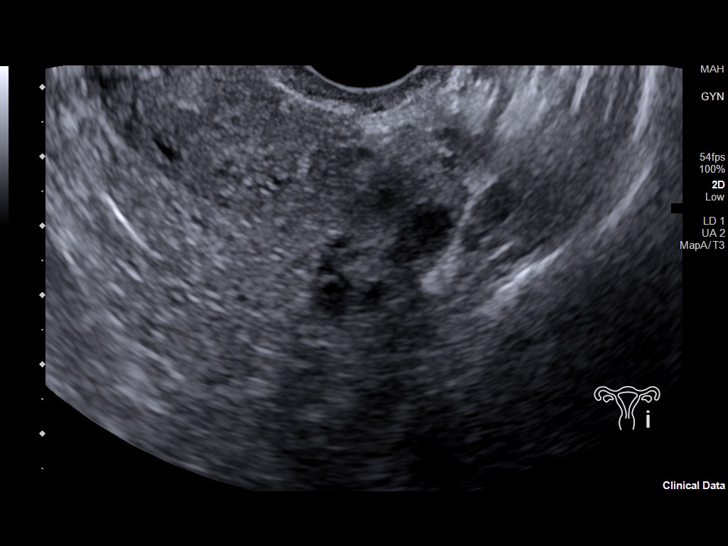
[im 33/49]
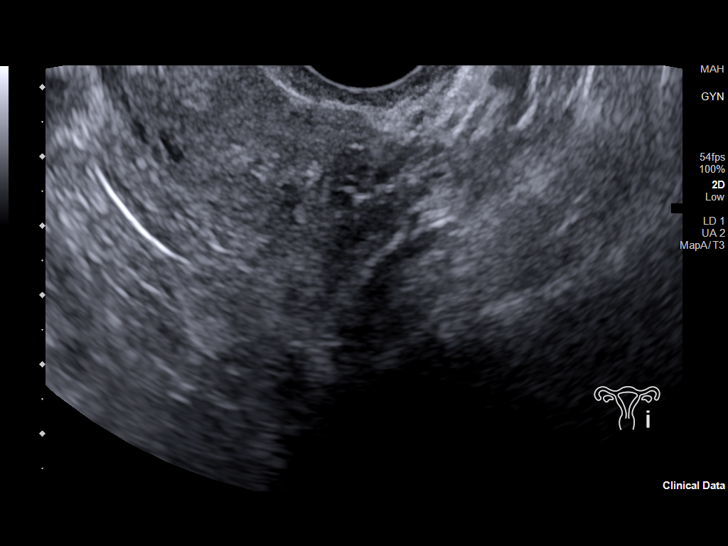
[im 37/49]
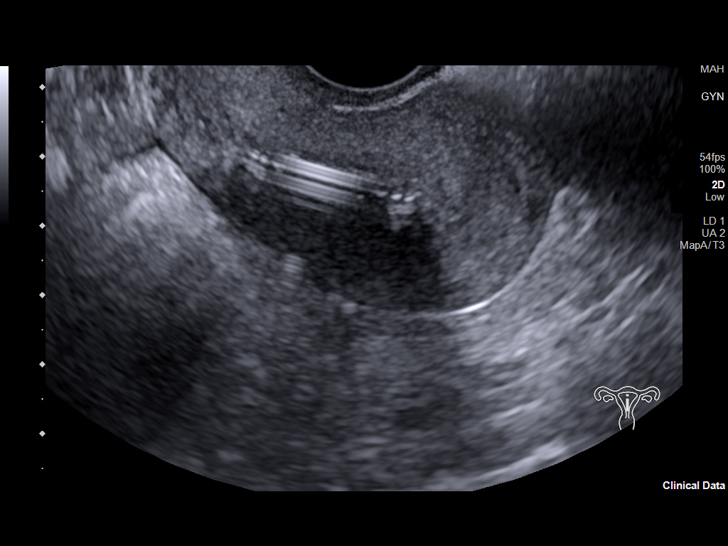
[im 41/49]
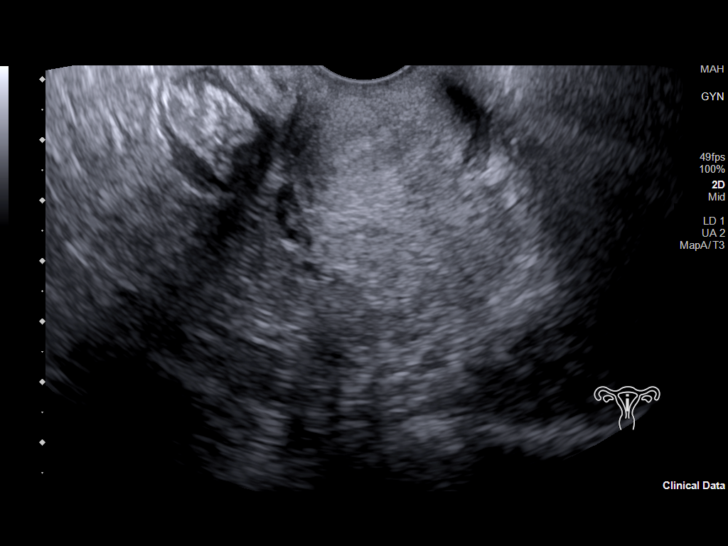
[im 45/49]
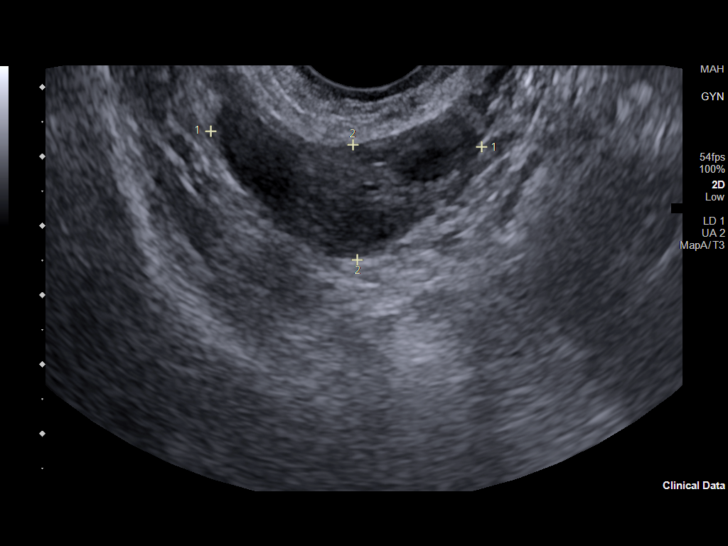
[im 49/49]
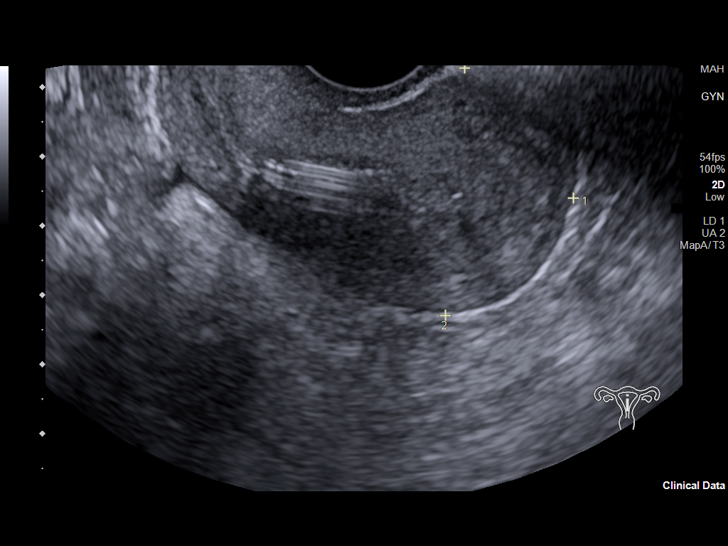

[14 of 25 positions shown; findings below may reference images not displayed]

FINDINGS: Uterus

Measurements: 6.3 x 3.6 x 4.6 cm = volume: 54 mL. No myometrial mass
identified.

Endometrium

Thickness: 6 mm. IUD identified with the tip near the top of the
uterine cavity.

Right ovary

Measurements: 3.9 x 1.7 x 3 cm = volume: 10 mL. Normal appearance/no
adnexal mass.

Left ovary

Measurements: 3.2 x 1.6 x 1.7 cm = volume: 4 mL. Normal
appearance/no adnexal mass.

Other findings:  No abnormal free fluid
IMPRESSION: 1. No acute process or suspicious pelvic mass identified.
2. IUD in-situ.

## 2023-02-12 ENCOUNTER — Encounter: Payer: Self-pay | Admitting: Medical-Surgical

## 2023-02-12 ENCOUNTER — Encounter: Payer: Self-pay | Admitting: Obstetrics and Gynecology

## 2023-02-18 ENCOUNTER — Other Ambulatory Visit (HOSPITAL_BASED_OUTPATIENT_CLINIC_OR_DEPARTMENT_OTHER): Payer: Self-pay

## 2023-02-18 ENCOUNTER — Ambulatory Visit (INDEPENDENT_AMBULATORY_CARE_PROVIDER_SITE_OTHER): Payer: 59 | Admitting: Medical-Surgical

## 2023-02-18 ENCOUNTER — Encounter: Payer: Self-pay | Admitting: Medical-Surgical

## 2023-02-18 VITALS — BP 99/66 | HR 75 | Resp 20 | Ht 63.0 in | Wt 224.6 lb

## 2023-02-18 DIAGNOSIS — M791 Myalgia, unspecified site: Secondary | ICD-10-CM | POA: Diagnosis not present

## 2023-02-18 DIAGNOSIS — Z6839 Body mass index (BMI) 39.0-39.9, adult: Secondary | ICD-10-CM | POA: Diagnosis not present

## 2023-02-18 DIAGNOSIS — E6609 Other obesity due to excess calories: Secondary | ICD-10-CM

## 2023-02-18 MED ORDER — TRAMADOL HCL 50 MG PO TABS
50.0000 mg | ORAL_TABLET | Freq: Three times a day (TID) | ORAL | 0 refills | Status: AC | PRN
  Filled 2023-02-18: qty 15, 5d supply, fill #0

## 2023-02-18 MED ORDER — TIRZEPATIDE 2.5 MG/0.5ML ~~LOC~~ SOAJ
2.5000 mg | SUBCUTANEOUS | 0 refills | Status: DC
Start: 2023-02-18 — End: 2023-05-01
  Filled 2023-02-18: qty 2, 28d supply, fill #0

## 2023-02-18 NOTE — Progress Notes (Signed)
        Established patient visit  History, exam, impression, and plan:  1. Class 2 obesity due to excess calories without serious comorbidity with body mass index (BMI) of 39.0 to 39.9 in adult Very pleasant 22 year old female resenting today to discuss options for weight management.  She has a history of obesity and although she has lost some weight on her own, she is not seeing as much progress as she would like.  Has done some investigation into weight loss medications and is very interested in trying to get Mounjaro/Zepbound approved through her insurance.  She stays physically active but is not regularly doing intentional exercise.  Has been trying to make healthier dietary choices.  Does note a possible diagnosis of PCOS but this is being managed by her OB/GYN and they have not made a definitive diagnosis just yet.  Historical A1c's were normal so no concern for prediabetes.  Discussed recommendations for weight loss.  Ultimately, advised that she will need to figure out what is contributing to her obesity to be able to provide lifelong weight maintenance.  We will try to get Greggory Keen covered through her insurance but advised that due to her no weight related comorbidities, this is a long shot.  Reviewed oral options as well as referral to healthy weight and wellness.  In meantime, would like for her to increase regular intentional exercise to at least 3 times weekly and incorporate cardiovascular activities as well as weight training.  Recommend using an app to log food intake to help direct dietary modifications. - tirzepatide [facility]) 2.5 MG/0.5ML Pen; Inject 2.5 mg into the skin once a week.  Dispense: 2 mL; Refill: 0  2. Myalgia A couple of days ago, had an unfortunate incident with a donkey where she was knocked down. Has been having body aches related to the incident.  Has to avoid NSAIDs and Tylenol is not helping at all.  Adding tramadol 50 mg 3 times daily as needed.  Advised to use this  sparingly and only for severe discomfort.   Procedures performed this visit: None.  Return in about 4 weeks (around 03/18/2023) for weight management follow up if able to get started on medication.  __________________________________ [name], DNP, APRN, FNP-BC Primary Care and Sports Medicine [facility]

## 2023-02-19 ENCOUNTER — Other Ambulatory Visit (HOSPITAL_BASED_OUTPATIENT_CLINIC_OR_DEPARTMENT_OTHER): Payer: Self-pay

## 2023-02-20 ENCOUNTER — Other Ambulatory Visit (HOSPITAL_BASED_OUTPATIENT_CLINIC_OR_DEPARTMENT_OTHER): Payer: Self-pay

## 2023-02-24 ENCOUNTER — Other Ambulatory Visit (HOSPITAL_BASED_OUTPATIENT_CLINIC_OR_DEPARTMENT_OTHER): Payer: Self-pay

## 2023-02-25 ENCOUNTER — Other Ambulatory Visit (HOSPITAL_BASED_OUTPATIENT_CLINIC_OR_DEPARTMENT_OTHER): Payer: Self-pay

## 2023-02-26 ENCOUNTER — Other Ambulatory Visit (HOSPITAL_BASED_OUTPATIENT_CLINIC_OR_DEPARTMENT_OTHER): Payer: Self-pay

## 2023-02-27 ENCOUNTER — Other Ambulatory Visit (HOSPITAL_BASED_OUTPATIENT_CLINIC_OR_DEPARTMENT_OTHER): Payer: Self-pay

## 2023-03-03 ENCOUNTER — Other Ambulatory Visit (HOSPITAL_BASED_OUTPATIENT_CLINIC_OR_DEPARTMENT_OTHER): Payer: Self-pay

## 2023-03-04 ENCOUNTER — Other Ambulatory Visit (HOSPITAL_BASED_OUTPATIENT_CLINIC_OR_DEPARTMENT_OTHER): Payer: Self-pay

## 2023-03-06 ENCOUNTER — Other Ambulatory Visit (HOSPITAL_BASED_OUTPATIENT_CLINIC_OR_DEPARTMENT_OTHER): Payer: Self-pay

## 2023-03-07 ENCOUNTER — Other Ambulatory Visit (HOSPITAL_BASED_OUTPATIENT_CLINIC_OR_DEPARTMENT_OTHER): Payer: Self-pay

## 2023-03-11 ENCOUNTER — Other Ambulatory Visit (HOSPITAL_BASED_OUTPATIENT_CLINIC_OR_DEPARTMENT_OTHER): Payer: Self-pay

## 2023-03-12 ENCOUNTER — Telehealth: Payer: Self-pay

## 2023-03-12 NOTE — Telephone Encounter (Signed)
Initiated Prior authorization ZOX:[id number] 2.5MG /0.5ML pen-injectors  Via: Covermymeds Case/Key:[id number] Status: Pending as of 03/12/23 Reason:pending denial no A1c of 6.5 Notified Pt via: Mychart

## 2023-03-13 ENCOUNTER — Other Ambulatory Visit (HOSPITAL_BASED_OUTPATIENT_CLINIC_OR_DEPARTMENT_OTHER): Payer: Self-pay

## 2023-03-21 ENCOUNTER — Encounter: Payer: 59 | Admitting: Medical-Surgical

## 2023-03-21 NOTE — Progress Notes (Unsigned)
Erroneous encounter

## 2023-04-01 ENCOUNTER — Encounter: Payer: Self-pay | Admitting: Medical-Surgical

## 2023-04-30 NOTE — Progress Notes (Signed)
        Established patient visit  History, exam, impression, and plan:  1. Migraine without status migrainosus, not intractable, unspecified migraine type Every pleasant 22 year old female presenting today with a history of migraines that has been increasing in frequency and severity.  Notes that her migraines usually start with pressure behind her eyes that increases until it involves her forehead and both temples.  Lately, her migraines have been coming almost daily and now are accompanied by visual changes such as blurriness or seeing white spots.  Taking topiramate 25 mg twice daily which used to work well for her but has lost some of its deficiency.  Had rizatriptan to use for migraine abortion but ran out of this and is requesting a refill.  Neuroexam unremarkable, reassuring.  No red flag symptoms today.  Plan to increase topiramate to 50 mg twice daily.  Refilling rizatriptan for as needed use.  Suspect that daily use of Sudafed may be contributing so advised to discontinue this.  Work on better control of chronic allergic rhinitis and sinus congestion (see below).  If no improvement with the increase in topiramate and these measures, advised to reach out in 2 to 3 weeks and we will plan for MRI of the brain. - rizatriptan (MAXALT-MLT) 5 MG disintegrating tablet; Take 1 tablet (5 mg total) by mouth as needed for migraine. May repeat in 2 hours if needed  Dispense: 10 tablet; Refill: 11 - topiramate (TOPAMAX) 50 MG tablet; Take 1 tablet (50 mg total) by mouth 2 (two) times daily.  Dispense: 180 tablet; Refill: 0  2. Anxiety Has been taking venlafaxine 75 mg daily, tolerating well without side effects.  Feels medication is working very well and keeping her mood stable.  Does have periods of breakthrough stress and anxiety but these are manageable.  Denies SI/HI.  Continue venlafaxine as prescribed. - venlafaxine XR (EFFEXOR XR) 75 MG 24 hr capsule; Take 1 capsule (75 mg total) by mouth daily with  breakfast.  Dispense: 90 capsule; Refill: 3  3. Chronic allergic rhinitis Has been taking Sudafed and an over-the-counter antihistamine daily to help manage chronic sinus congestion and rhinitis.  Even with these medications, she continues to have issues.  As noted above, feel that daily Sudafed may be contributing to the increase in headaches so would like her to discontinue this.  Okay to continue over-the-counter antihistamine.  Adding Singulair 10 mg nightly.  Consider Flonase nasal spray as an adjunct if needed.   Procedures performed this visit: None.  Return if symptoms worsen or fail to improve.  __________________________________ [name], DNP, APRN, FNP-BC Primary Care and Sports Medicine [facility]

## 2023-05-01 ENCOUNTER — Other Ambulatory Visit (HOSPITAL_BASED_OUTPATIENT_CLINIC_OR_DEPARTMENT_OTHER): Payer: Self-pay

## 2023-05-01 ENCOUNTER — Encounter: Payer: Self-pay | Admitting: Medical-Surgical

## 2023-05-01 ENCOUNTER — Ambulatory Visit (INDEPENDENT_AMBULATORY_CARE_PROVIDER_SITE_OTHER): Payer: 59 | Admitting: Medical-Surgical

## 2023-05-01 VITALS — BP 111/74 | HR 82 | Resp 18 | Ht 63.0 in | Wt 229.8 lb

## 2023-05-01 DIAGNOSIS — J309 Allergic rhinitis, unspecified: Secondary | ICD-10-CM

## 2023-05-01 DIAGNOSIS — G43909 Migraine, unspecified, not intractable, without status migrainosus: Secondary | ICD-10-CM | POA: Diagnosis not present

## 2023-05-01 DIAGNOSIS — F419 Anxiety disorder, unspecified: Secondary | ICD-10-CM | POA: Diagnosis not present

## 2023-05-01 MED ORDER — MONTELUKAST SODIUM 10 MG PO TABS
10.0000 mg | ORAL_TABLET | Freq: Every day | ORAL | 3 refills | Status: DC
  Filled 2023-05-01: qty 30, 30d supply, fill #0
  Filled 2023-06-27 (×2): qty 30, 30d supply, fill #1
  Filled 2023-09-05: qty 30, 30d supply, fill #2
  Filled 2023-10-20: qty 30, 30d supply, fill #3

## 2023-05-01 MED ORDER — TOPIRAMATE 50 MG PO TABS
50.0000 mg | ORAL_TABLET | Freq: Two times a day (BID) | ORAL | 0 refills | Status: DC
Start: 2023-05-01 — End: 2023-09-05
  Filled 2023-05-01: qty 180, 90d supply, fill #0

## 2023-05-01 MED ORDER — VENLAFAXINE HCL ER 75 MG PO CP24
75.0000 mg | ORAL_CAPSULE | Freq: Every day | ORAL | 3 refills | Status: DC
Start: 2023-05-01 — End: 2023-09-02
  Filled 2023-05-01: qty 90, 90d supply, fill #0

## 2023-05-01 MED ORDER — RIZATRIPTAN BENZOATE 5 MG PO TBDP
5.0000 mg | ORAL_TABLET | ORAL | 11 refills | Status: DC | PRN
Start: 2023-05-01 — End: 2024-06-17
  Filled 2023-05-01: qty 10, 5d supply, fill #0
  Filled 2024-04-19: qty 10, 5d supply, fill #1

## 2023-05-02 ENCOUNTER — Other Ambulatory Visit (HOSPITAL_BASED_OUTPATIENT_CLINIC_OR_DEPARTMENT_OTHER): Payer: Self-pay

## 2023-05-07 ENCOUNTER — Encounter: Payer: Self-pay | Admitting: Medical-Surgical

## 2023-05-07 DIAGNOSIS — R519 Headache, unspecified: Secondary | ICD-10-CM

## 2023-05-10 ENCOUNTER — Ambulatory Visit: Payer: 59

## 2023-05-10 DIAGNOSIS — R519 Headache, unspecified: Secondary | ICD-10-CM

## 2023-05-20 ENCOUNTER — Ambulatory Visit (INDEPENDENT_AMBULATORY_CARE_PROVIDER_SITE_OTHER): Payer: 59 | Admitting: Medical-Surgical

## 2023-05-20 ENCOUNTER — Other Ambulatory Visit (HOSPITAL_BASED_OUTPATIENT_CLINIC_OR_DEPARTMENT_OTHER): Payer: Self-pay

## 2023-05-20 ENCOUNTER — Encounter: Payer: Self-pay | Admitting: Medical-Surgical

## 2023-05-20 VITALS — BP 137/83 | HR 73 | Ht 63.0 in | Wt 228.1 lb

## 2023-05-20 DIAGNOSIS — N898 Other specified noninflammatory disorders of vagina: Secondary | ICD-10-CM | POA: Diagnosis not present

## 2023-05-20 DIAGNOSIS — B3731 Acute candidiasis of vulva and vagina: Secondary | ICD-10-CM

## 2023-05-20 DIAGNOSIS — R202 Paresthesia of skin: Secondary | ICD-10-CM | POA: Diagnosis not present

## 2023-05-20 DIAGNOSIS — R7309 Other abnormal glucose: Secondary | ICD-10-CM | POA: Diagnosis not present

## 2023-05-20 LAB — POCT URINALYSIS DIP (CLINITEK)
Bilirubin, UA: NEGATIVE
Blood, UA: NEGATIVE
Glucose, UA: NEGATIVE mg/dL
Ketones, POC UA: NEGATIVE mg/dL
Leukocytes, UA: NEGATIVE
Nitrite, UA: NEGATIVE
POC PROTEIN,UA: NEGATIVE
Spec Grav, UA: 1.025 (ref 1.010–1.025)
Urobilinogen, UA: 0.2 E.U./dL
pH, UA: 6 (ref 5.0–8.0)

## 2023-05-20 LAB — POCT GLYCOSYLATED HEMOGLOBIN (HGB A1C): Hemoglobin A1C: 5.4 % (ref 4.0–5.6)

## 2023-05-20 MED ORDER — FLUCONAZOLE 150 MG PO TABS
150.0000 mg | ORAL_TABLET | Freq: Once | ORAL | 0 refills | Status: AC
  Filled 2023-05-20: qty 1, 1d supply, fill #0

## 2023-05-20 MED ORDER — CLOTRIMAZOLE-BETAMETHASONE 1-0.05 % EX CREA
1.0000 | TOPICAL_CREAM | Freq: Every day | CUTANEOUS | 0 refills | Status: DC
  Filled 2023-05-20: qty 30, 30d supply, fill #0

## 2023-05-20 NOTE — Progress Notes (Unsigned)
        Established patient visit  History, exam, impression, and plan:  1. Vaginal irritation 2. Vaginal yeast infection 3. Vaginal discharge Pleasant 22 year old female presenting today with reports of vaginal irritation, burning, and discharge.  Unsure if the burning is related to a UTI or if there is a yeast infection.  She does have a history of recurrent yeast infections and has noted increased thick white discharge.  No new cosmetics, cleansers, lotions, lubricants, or materials.  Most recent episode of intercourse was painful.  Has seen some light spotting but no irregular bleeding.  Denies vaginal odor.  Uses condoms with her partner intermittently.  Tried over-the-counter Monistat 1 day which helped a little.  She went back and got a 3-day Monistat however only completed 1 day of this.  On exam, no active lesions.  Labia minora and vaginal introitus erythematous and tender.  No significant discharge noted.  POCT UA negative.  Due to the recurrent nature of her yeast infections, Aptima swab collected to send for BV and Candida with speciation to guide further treatment.  Today adding Diflucan 150 mg x 1 and topical Lotrisone. - POCT URINALYSIS DIP (CLINITEK) - NuSwab BV and Candida, NAA  4. Elevated glucose 5. Paresthesia of foot Continues to have numbness and tingling in her right great toe.  Has checked her sugar at home and several times has seen elevations greater than 200.  Checking hemoglobin A1c with result of 5.4%.  Reassured patient that this is normal but she should certainly watch her dietary intake for foods that cause significant glucose elevations and work on limiting simple carbohydrates/concentrated sweets. - POCT HgB A1C  Procedures performed this visit: None.  Return if symptoms worsen or fail to improve.  __________________________________ [name], DNP, APRN, FNP-BC Primary Care and Sports Medicine [facility]

## 2023-05-22 ENCOUNTER — Other Ambulatory Visit: Payer: Self-pay

## 2023-05-27 ENCOUNTER — Ambulatory Visit (INDEPENDENT_AMBULATORY_CARE_PROVIDER_SITE_OTHER): Payer: 59 | Admitting: Obstetrics and Gynecology

## 2023-05-27 ENCOUNTER — Encounter: Payer: Self-pay | Admitting: Obstetrics and Gynecology

## 2023-05-27 VITALS — BP 123/82 | HR 84 | Ht 63.0 in | Wt 230.0 lb

## 2023-05-27 DIAGNOSIS — N921 Excessive and frequent menstruation with irregular cycle: Secondary | ICD-10-CM

## 2023-05-27 DIAGNOSIS — Z975 Presence of (intrauterine) contraceptive device: Secondary | ICD-10-CM

## 2023-05-27 NOTE — Progress Notes (Signed)
Patient states Beth Holmes takes norethindrone for the bleeding between periods on her IUD.  Did Nuswab at PCP last week and was WNL.  Patient complaining of vaginal irritation. Patient given lotrisone cream for vaginal irritation from PCP last week. Armandina Stammer RN

## 2023-05-27 NOTE — Progress Notes (Signed)
   GYNECOLOGY PROGRESS NOTE  History:  22 y.o. G1P0010 presents to [facility] office today for problem gyn visit. She has significant hx of chronic pelvic pain, endometriosis, dyspareunia, and vulvodynia.She continues to have some pelvic discomfort and nausea with IUD in place. She has been seen here as well as gyn with [facility]. Had imaging to assess IUD, which revealed iud in upper uterine segment, larger left simple ovarian cyst.Bleeds everyday with IUD if she is not taking norethindrone daily.She has tried pelvic floor pt, OCPs, gabapentin in the past.  Patient states Corky Sox takes norethindrone for the bleeding between periods on her IUD.  Did Nuswab at PCP last week and was WNL.   Patient complaining of vaginal irritation. Patient given lotrisone cream for vaginal irritation from PCP last week. Swabs negative     Review of Systems:  Pertinent items are noted in HPI.   Objective:  Physical Exam Blood pressure 123/82, pulse 84, height 5\' 3"  (1.6 m), weight 230 lb (104.3 kg). VS reviewed, nursing note reviewed,  Constitutional: well developed, well nourished, no distress HEENT: normocephalic CV: normal rate Pulm/chest wall: normal effort Breast Exam: deferred Abdomen: soft Neuro: alert and oriented x 3 Skin: warm, dry Psych: affect normal Pelvic exam: deferred  Assessment & Plan:   1. Breakthrough bleeding with IUD Discussed removal/reinsertion, unsure if she wants to continue with IUD and having to take norethindrone daily.. Considering other options. Discussed coming back for removal and replacement when she has finished the 7 days of norethindrone to see if this will help breakthrough bleeding. Unable to visualize or palpate strings at last visit, but had an u/s to show it is in the upper uterine segment. Will bring back to discuss different contraceptive option for endometriosis/chronic pelvic pain. Continue norethindrone for now.   Return in about 4 weeks (around 06/24/2023),  or MD visi ort iud removal/insertion or another option.   Future Appointments  Date Time Provider Department Center  06/26/2023  1:50 PM Anyanwu, Jethro Bastos, MD [facility]     Albertine Grates, FNP

## 2023-05-28 ENCOUNTER — Other Ambulatory Visit: Payer: Self-pay | Admitting: Medical-Surgical

## 2023-05-28 DIAGNOSIS — R053 Chronic cough: Secondary | ICD-10-CM

## 2023-05-28 DIAGNOSIS — R634 Abnormal weight loss: Secondary | ICD-10-CM

## 2023-05-28 DIAGNOSIS — R1013 Epigastric pain: Secondary | ICD-10-CM

## 2023-05-28 DIAGNOSIS — R1111 Vomiting without nausea: Secondary | ICD-10-CM

## 2023-05-29 ENCOUNTER — Other Ambulatory Visit (HOSPITAL_BASED_OUTPATIENT_CLINIC_OR_DEPARTMENT_OTHER): Payer: Self-pay

## 2023-05-29 ENCOUNTER — Other Ambulatory Visit: Payer: Self-pay

## 2023-05-29 MED ORDER — PANTOPRAZOLE SODIUM 40 MG PO TBEC
40.0000 mg | DELAYED_RELEASE_TABLET | Freq: Two times a day (BID) | ORAL | 0 refills | Status: DC
Start: 2023-05-29 — End: 2023-09-02
  Filled 2023-05-29: qty 180, 90d supply, fill #0

## 2023-06-12 ENCOUNTER — Encounter: Payer: Self-pay | Admitting: Medical-Surgical

## 2023-06-12 ENCOUNTER — Telehealth: Payer: 59 | Admitting: Medical-Surgical

## 2023-06-12 ENCOUNTER — Other Ambulatory Visit (HOSPITAL_BASED_OUTPATIENT_CLINIC_OR_DEPARTMENT_OTHER): Payer: Self-pay

## 2023-06-12 DIAGNOSIS — Z6841 Body Mass Index (BMI) 40.0 and over, adult: Secondary | ICD-10-CM | POA: Diagnosis not present

## 2023-06-12 DIAGNOSIS — R519 Headache, unspecified: Secondary | ICD-10-CM | POA: Diagnosis not present

## 2023-06-12 DIAGNOSIS — N898 Other specified noninflammatory disorders of vagina: Secondary | ICD-10-CM

## 2023-06-12 MED ORDER — WEGOVY 0.25 MG/0.5ML ~~LOC~~ SOAJ
0.2500 mg | SUBCUTANEOUS | 0 refills | Status: DC
Start: 2023-06-12 — End: 2023-10-17
  Filled 2023-06-12 – 2023-06-17 (×4): qty 2, 28d supply, fill #0

## 2023-06-12 MED ORDER — METOPROLOL SUCCINATE ER 25 MG PO TB24
25.0000 mg | ORAL_TABLET | Freq: Every day | ORAL | 3 refills | Status: DC
  Filled 2023-06-12: qty 90, 90d supply, fill #0
  Filled 2023-10-20: qty 90, 90d supply, fill #1

## 2023-06-12 NOTE — Progress Notes (Signed)
Virtual Visit via Video Note  I connected with Beth Holmes on 06/12/23 at  2:00 PM EDT by a video enabled telemedicine application and verified that I am speaking with the correct person using two identifiers.   I discussed the limitations of evaluation and management by telemedicine and the availability of in person appointments. The patient expressed understanding and agreed to proceed.  Patient location: home Provider locations: office  Subjective:    CC: Headaches, weight loss  HPI: Pleasant 22 year old female presenting via MyChart video visit for the following:  Vaginal irritation: Has been using Lotrisone to the vaginal area with some benefit.  Unfortunately, the irritation keeps coming back so she has had to continue using the Lotrisone every few days.  After she saw me for the vaginal irritation, she was able to see OB/GYN but reports that they never did an exam and only advised to continue using the cream.  Irritation is resolved for now but seems to flare after sexual intercourse.  She is with a female partner and they use condoms intermittently.  The irritation is not better or worse with or without condoms.  Headaches: Tried increasing Topamax to 100 mg but started to experience paresthesias so dropped it back to 50 mg as previously prescribed.  Using Maxalt for migraine abortion.  Interested in switching to a different preventative medication since she continues to have frequent headaches on the max tolerable Topamax.  Weight: Continues to worry significantly about her weight and reports this is a huge contributor to her self esteem and confidence levels.  She has made multiple dietary changes and has been working on increased exercise.  Has not been able to lose any weight so far.  Is interested in [place] as a family member started this and has had excellent results with it.  Past medical history, Surgical history, Family history not pertinant except as noted below, Social  history, Allergies, and medications have been entered into the medical record, reviewed, and corrections made.   Review of Systems: See HPI for pertinent positives and negatives.   Objective:    General: Speaking clearly in complete sentences without any shortness of breath.  Alert and oriented x3.  Normal judgment. No apparent acute distress.  Impression and Recommendations:    1. Worsening headaches Discussed options for preventative care measures and migraine abortive medication choices.  Continue Maxalt as prescribed.  Plan to continue Topamax at the 50 mg dose for now.  Adding Toprol XL 25 mg daily.  If this is helpful in controlling her migraines, we will revisit the need for Topamax and work on tapering down to the least effective dose.  2. Class 3 severe obesity due to excess calories without serious comorbidity with body mass index (BMI) of 40.0 to 44.9 in adult [facility]) Discussed medication options and the barrier of insurance coverage/affordability.  She is still interested in trying for [facility].  With her BMI greater than 40, I feel that this is appropriate as she has already been making dietary modifications and increasing exercise with little result.  Sending in Wegovy 0.25 mg weekly for the first 4 weeks.  3. Vaginal irritation No current issues.  Advised to return to the office for reevaluation if this flares again.  We may need to do a skin scraping versus a punch biopsy for further differentiation.  I discussed the assessment and treatment plan with the patient. The patient was provided an opportunity to ask questions and all were answered. The patient agreed with  the plan and demonstrated an understanding of the instructions.   The patient was advised to call back or seek an in-person evaluation if the symptoms worsen or if the condition fails to improve as anticipated.  Return in about 4 weeks (around 07/10/2023) for Headache/weight follow-up.  [name], DNP, APRN,  FNP-BC  [facility] and Sports Medicine

## 2023-06-13 ENCOUNTER — Telehealth: Payer: Self-pay | Admitting: Medical-Surgical

## 2023-06-13 ENCOUNTER — Other Ambulatory Visit (HOSPITAL_COMMUNITY): Payer: Self-pay

## 2023-06-13 ENCOUNTER — Other Ambulatory Visit (HOSPITAL_BASED_OUTPATIENT_CLINIC_OR_DEPARTMENT_OTHER): Payer: Self-pay

## 2023-06-13 NOTE — Telephone Encounter (Signed)
Patient called in for a PA for [facility]. Please Advise

## 2023-06-13 NOTE — Telephone Encounter (Signed)
Rep with PA Dept called on behalf of [employer]. Re:  Status update  Wegovy 0.25mg  per 0.5 ml was denied, Ref # [id number]. Will send correspondence  letter by fax.

## 2023-06-17 ENCOUNTER — Other Ambulatory Visit (HOSPITAL_BASED_OUTPATIENT_CLINIC_OR_DEPARTMENT_OTHER): Payer: Self-pay

## 2023-06-17 ENCOUNTER — Encounter: Payer: Self-pay | Admitting: Medical-Surgical

## 2023-06-26 ENCOUNTER — Ambulatory Visit: Payer: 59 | Admitting: Obstetrics and Gynecology

## 2023-06-27 ENCOUNTER — Other Ambulatory Visit (HOSPITAL_COMMUNITY): Payer: Self-pay

## 2023-06-27 ENCOUNTER — Other Ambulatory Visit: Payer: Self-pay

## 2023-06-27 ENCOUNTER — Other Ambulatory Visit (HOSPITAL_BASED_OUTPATIENT_CLINIC_OR_DEPARTMENT_OTHER): Payer: Self-pay

## 2023-07-03 ENCOUNTER — Other Ambulatory Visit (HOSPITAL_COMMUNITY): Payer: Self-pay

## 2023-07-03 MED ORDER — AZITHROMYCIN 250 MG PO TABS
250.0000 mg | ORAL_TABLET | Freq: Every day | ORAL | 0 refills | Status: DC
  Filled 2023-07-03: qty 6, 5d supply, fill #0

## 2023-07-10 ENCOUNTER — Encounter: Payer: Self-pay | Admitting: Medical-Surgical

## 2023-07-10 ENCOUNTER — Other Ambulatory Visit (HOSPITAL_BASED_OUTPATIENT_CLINIC_OR_DEPARTMENT_OTHER): Payer: Self-pay

## 2023-07-10 ENCOUNTER — Ambulatory Visit (INDEPENDENT_AMBULATORY_CARE_PROVIDER_SITE_OTHER): Payer: 59 | Admitting: Medical-Surgical

## 2023-07-10 ENCOUNTER — Other Ambulatory Visit: Payer: Self-pay | Admitting: Medical-Surgical

## 2023-07-10 VITALS — BP 104/70 | HR 77 | Resp 20 | Ht 63.0 in | Wt 228.0 lb

## 2023-07-10 DIAGNOSIS — N898 Other specified noninflammatory disorders of vagina: Secondary | ICD-10-CM | POA: Diagnosis not present

## 2023-07-10 DIAGNOSIS — R1111 Vomiting without nausea: Secondary | ICD-10-CM

## 2023-07-10 DIAGNOSIS — K120 Recurrent oral aphthae: Secondary | ICD-10-CM

## 2023-07-10 DIAGNOSIS — R634 Abnormal weight loss: Secondary | ICD-10-CM

## 2023-07-10 DIAGNOSIS — Z23 Encounter for immunization: Secondary | ICD-10-CM

## 2023-07-10 DIAGNOSIS — R053 Chronic cough: Secondary | ICD-10-CM

## 2023-07-10 DIAGNOSIS — R1013 Epigastric pain: Secondary | ICD-10-CM

## 2023-07-10 MED ORDER — VALACYCLOVIR HCL 500 MG PO TABS
500.0000 mg | ORAL_TABLET | Freq: Two times a day (BID) | ORAL | 1 refills | Status: DC | PRN
  Filled 2023-07-10: qty 60, 30d supply, fill #0
  Filled 2023-09-05: qty 60, 30d supply, fill #1
  Filled 2023-12-05: qty 60, 30d supply, fill #2

## 2023-07-10 MED ORDER — FLUCONAZOLE 150 MG PO TABS
150.0000 mg | ORAL_TABLET | Freq: Once | ORAL | 0 refills | Status: AC
Start: 2023-07-10 — End: 2023-07-11
  Filled 2023-07-10: qty 2, 3d supply, fill #0

## 2023-07-10 NOTE — Progress Notes (Signed)
        Established patient visit  History, exam, impression, and plan:  1. Vaginal irritation Pleasant 22 year old female presenting today with reports of a recurrence of severe vaginal irritation on the external surfaces of the labia, clitoris, and vaginal introitus.  She had this occur in early August and was negative for yeast, trichomonas, and bacterial vaginosis.  Today, she reports that she recently was treated with azithromycin and has had some thick white vaginal discharge.  No recent irritants.  Avoids soap in the vaginal area.  Avoiding underwear and is wearing loose close.  She does have an OB/GYN as well as a specialist but has not seen them for this issue.  Yesterday restarted using Lotrisone cream and has not seen much benefit yet.  Deferring exam as this is a recurrence of her previous symptoms.  Sending wet prep for further evaluation.  As she has recently been treated with antibiotics, I will go ahead and treat with Diflucan 150 mg once with repeat dose in 72 hours if needed.  Okay to use to Lotrisone twice daily for up to 14 days to the vaginal area. - WET PREP FOR TRICH, YEAST, CLUE  2. Need for influenza vaccination Flu vaccine given in office today. - Flu vaccine trivalent PF, 6mos and older(Flulaval,Afluria,Fluarix,Fluzone)   Procedures performed this visit: None.  Return if symptoms worsen or fail to improve.  __________________________________ [name], DNP, APRN, FNP-BC Primary Care and Sports Medicine [facility] [place]

## 2023-07-11 ENCOUNTER — Other Ambulatory Visit: Payer: Self-pay | Admitting: Medical-Surgical

## 2023-07-11 ENCOUNTER — Other Ambulatory Visit (HOSPITAL_BASED_OUTPATIENT_CLINIC_OR_DEPARTMENT_OTHER): Payer: Self-pay

## 2023-07-11 DIAGNOSIS — R1111 Vomiting without nausea: Secondary | ICD-10-CM

## 2023-07-11 DIAGNOSIS — R634 Abnormal weight loss: Secondary | ICD-10-CM

## 2023-07-11 DIAGNOSIS — R1013 Epigastric pain: Secondary | ICD-10-CM

## 2023-07-11 DIAGNOSIS — R053 Chronic cough: Secondary | ICD-10-CM

## 2023-07-11 LAB — WET PREP FOR TRICH, YEAST, CLUE
Clue Cell Exam: NEGATIVE
Trichomonas Exam: NEGATIVE
Yeast Exam: POSITIVE — AB

## 2023-08-05 ENCOUNTER — Ambulatory Visit (INDEPENDENT_AMBULATORY_CARE_PROVIDER_SITE_OTHER): Payer: 59 | Admitting: Medical-Surgical

## 2023-08-05 ENCOUNTER — Encounter: Payer: Self-pay | Admitting: Medical-Surgical

## 2023-08-05 VITALS — BP 111/74 | HR 90 | Resp 20 | Ht 63.0 in | Wt 223.0 lb

## 2023-08-05 DIAGNOSIS — M2550 Pain in unspecified joint: Secondary | ICD-10-CM | POA: Diagnosis not present

## 2023-08-05 MED ORDER — PREDNISONE 50 MG PO TABS
50.0000 mg | ORAL_TABLET | Freq: Every day | ORAL | 0 refills | Status: DC
Start: 2023-08-05 — End: 2023-09-02

## 2023-08-05 NOTE — Progress Notes (Signed)
        Established patient visit  History, exam, impression, and plan:  1. Polyarthralgia Pleasant 22 year old female presenting today for evaluation of polyarthralgia.  She was seen in urgent care recently and diagnosed with rhinovirus.  Notes that she hurts all over her body and her joints.  A similar situation happened in April when she was very sick and she was sick for several months.  Notes that even sitting without activity causes her to ache all over.  She is affected bilaterally.  No recent injuries or falls.  At urgent care, due to some labs and found her CRP was elevated.  On review of her previous labs, her CRP and ESR was elevated.  Unclear etiology.  She has not had strep recently so low suspicion for rheumatic fever.  She does have generalized malaise, body aches, and intermittent chills.  Lingering viral symptoms with sinus congestion and postnasal drip.  Also has a dry cough.  Reports her major symptom is the joint pain so plan to evaluate that with labs as below.  Adding prednisone 50 mg daily x 5 days while awaiting lab results. - Rheumatoid Arthritis Profile - Systemic Lupus Profile A - EPSTEIN-BARR VIRUS (EBV) Antibody Profile  Procedures performed this visit: None.  Return if symptoms worsen or fail to improve.  __________________________________ [name], DNP, APRN, FNP-BC Primary Care and Sports Medicine [facility] [place]

## 2023-08-06 ENCOUNTER — Encounter: Payer: Self-pay | Admitting: Medical-Surgical

## 2023-08-07 LAB — SYSTEMIC LUPUS PROFILE A
Chromatin Ab SerPl-aCnc: <0.2 AI (ref 0.0–0.9)
ENA RNP Ab: 0.9 AI (ref 0.0–0.9)
ENA SM Ab Ser-aCnc: <0.2 AI (ref 0.0–0.9)
ENA SSA (RO) Ab: <0.2 AI (ref 0.0–0.9)
ENA SSB (LA) Ab: <0.2 AI (ref 0.0–0.9)
Rheumatoid fact SerPl-aCnc: <10 [IU]/mL (ref <14.0)
dsDNA Ab: <1 [IU]/mL (ref 0–9)

## 2023-08-07 LAB — EPSTEIN-BARR VIRUS (EBV) ANTIBODY PROFILE
EBV NA IgG: 118 U/mL — ABNORMAL HIGH (ref 0.0–17.9)
EBV VCA IgG: >600 U/mL — ABNORMAL HIGH (ref 0.0–17.9)
EBV VCA IgM: 65.3 U/mL — ABNORMAL HIGH (ref 0.0–35.9)

## 2023-08-07 LAB — RHEUMATOID ARTHRITIS PROFILE: Cyclic Citrullin Peptide Ab: 10 U (ref 0–19)

## 2023-08-27 ENCOUNTER — Encounter: Payer: Self-pay | Admitting: Medical-Surgical

## 2023-09-02 ENCOUNTER — Other Ambulatory Visit (HOSPITAL_BASED_OUTPATIENT_CLINIC_OR_DEPARTMENT_OTHER): Payer: Self-pay

## 2023-09-02 ENCOUNTER — Encounter: Payer: Self-pay | Admitting: Medical-Surgical

## 2023-09-02 ENCOUNTER — Ambulatory Visit (INDEPENDENT_AMBULATORY_CARE_PROVIDER_SITE_OTHER): Payer: 59 | Admitting: Medical-Surgical

## 2023-09-02 ENCOUNTER — Ambulatory Visit: Payer: 59

## 2023-09-02 VITALS — BP 106/67 | HR 79 | Resp 20 | Ht 63.0 in | Wt 231.0 lb

## 2023-09-02 DIAGNOSIS — R1013 Epigastric pain: Secondary | ICD-10-CM

## 2023-09-02 DIAGNOSIS — M5416 Radiculopathy, lumbar region: Secondary | ICD-10-CM | POA: Diagnosis not present

## 2023-09-02 DIAGNOSIS — F419 Anxiety disorder, unspecified: Secondary | ICD-10-CM

## 2023-09-02 DIAGNOSIS — M5441 Lumbago with sciatica, right side: Secondary | ICD-10-CM

## 2023-09-02 MED ORDER — PANTOPRAZOLE SODIUM 40 MG PO TBEC
40.0000 mg | DELAYED_RELEASE_TABLET | Freq: Two times a day (BID) | ORAL | 0 refills | Status: DC
  Filled 2023-09-02: qty 180, 90d supply, fill #0

## 2023-09-02 MED ORDER — DEXAMETHASONE SODIUM PHOSPHATE 10 MG/ML IJ SOLN
10.0000 mg | Freq: Once | INTRAMUSCULAR | Status: AC
  Administered 2023-09-02: 10 mg via INTRAMUSCULAR

## 2023-09-02 MED ORDER — KETOROLAC TROMETHAMINE 60 MG/2ML IM SOLN
60.0000 mg | Freq: Once | INTRAMUSCULAR | Status: AC
  Administered 2023-09-02: 60 mg via INTRAMUSCULAR

## 2023-09-02 MED ORDER — METHYLPREDNISOLONE 4 MG PO TBPK
ORAL_TABLET | ORAL | 0 refills | Status: DC
  Filled 2023-09-02: qty 21, 6d supply, fill #0

## 2023-09-02 MED ORDER — VENLAFAXINE HCL ER 150 MG PO CP24
150.0000 mg | ORAL_CAPSULE | Freq: Every day | ORAL | 1 refills | Status: DC
  Filled 2023-09-02: qty 90, 90d supply, fill #0
  Filled 2023-10-20: qty 30, 30d supply, fill #1
  Filled 2023-12-05: qty 30, 30d supply, fill #2

## 2023-09-02 NOTE — Progress Notes (Signed)
        Established patient visit  History, exam, impression, and plan:  1. Lumbar radiculopathy Beth Holmes 22 year old female presenting today with reports of right-sided lumbar spine pain that extends into the SI joint and into the right hip.  Also experiencing sciatica that extends down the back of the thigh but stops at the back of the knee.  This has been going on for last several weeks.  She has a physically active job as a Psychologist, sport and exercise and spends a lot of her time pushing, pulling, and lifting.  She is also a partial caretaker for her grandfather who just had his second BKA and is struggling to relearn mobility.  On exam, tenderness to the right paraspinal muscles in the lumbar region.  Also tender over the SI joint.  Pain with straight leg raise and Pearlean Brownie on the right side.  Getting lumbar spine x-rays as we have no imaging on file.  Toradol and Decadron given IM in the office today.  Start Medrol dose pack tomorrow for a 6-day taper.  After that, okay to use Aleve 1 tablet twice daily.  Home exercises provided for physical therapy rehab.   - DG Lumbar Spine Complete; Future - ketorolac (TORADOL) injection 60 mg - dexamethasone (DECADRON) injection 10 mg  2. Epigastric pain Has a GI provider but has not been able to get in with them.  She has run out of pantoprazole and has had a resurgence of her GI symptoms.  Refilling pantoprazole today with instructions to get in with GI for further refills. - pantoprazole (PROTONIX) 40 MG tablet; Take 1 tablet (40 mg total) by mouth 2 (two) times daily before a meal.  Dispense: 180 tablet; Refill: 0  3. Anxiety Has been having difficulty with significant anxiety despite being treated with venlafaxine XR 75 mg daily.  Has a lot of situational stressors as noted above with work, home relationships, and caregiver stress.  Feels that her symptoms are not manageable and she is interested in medication adjustment.  Denies SI/HI.  Increasing venlafaxine XR to 150  mg daily.  Plan to follow-up closely in 4 weeks to reevaluate. - venlafaxine XR (EFFEXOR XR) 150 MG 24 hr capsule; Take 1 capsule (150 mg total) by mouth daily with breakfast.  Dispense: 90 capsule; Refill: 1  Procedures performed this visit: None.  Return in about 4 weeks (around 09/30/2023) for mood follow up.  __________________________________ [name], DNP, APRN, FNP-BC Primary Care and Sports Medicine [facility] [place]

## 2023-09-05 ENCOUNTER — Other Ambulatory Visit: Payer: Self-pay

## 2023-09-05 ENCOUNTER — Other Ambulatory Visit: Payer: Self-pay | Admitting: Medical-Surgical

## 2023-09-05 ENCOUNTER — Other Ambulatory Visit (HOSPITAL_BASED_OUTPATIENT_CLINIC_OR_DEPARTMENT_OTHER): Payer: Self-pay

## 2023-09-05 DIAGNOSIS — G43909 Migraine, unspecified, not intractable, without status migrainosus: Secondary | ICD-10-CM

## 2023-09-05 MED ORDER — TOPIRAMATE 50 MG PO TABS
50.0000 mg | ORAL_TABLET | Freq: Two times a day (BID) | ORAL | 0 refills | Status: DC
  Filled 2023-09-05 – 2023-10-20 (×2): qty 180, 90d supply, fill #0

## 2023-09-06 ENCOUNTER — Encounter: Payer: Self-pay | Admitting: Medical-Surgical

## 2023-09-09 NOTE — Telephone Encounter (Signed)
Left message for a call back to schedule an appointment.

## 2023-09-16 ENCOUNTER — Other Ambulatory Visit (HOSPITAL_BASED_OUTPATIENT_CLINIC_OR_DEPARTMENT_OTHER): Payer: Self-pay

## 2023-10-01 ENCOUNTER — Ambulatory Visit: Payer: 59 | Admitting: Medical-Surgical

## 2023-10-01 NOTE — Progress Notes (Deleted)
        Established patient visit  History, exam, impression, and plan:  No problem-specific Assessment & Plan notes found for this encounter.  ROS  Physical Exam   Procedures performed this visit: None.  No follow-ups on file.  __________________________________ [name], DNP, APRN, FNP-BC Primary Care and Sports Medicine [facility] [place]

## 2023-10-17 ENCOUNTER — Ambulatory Visit (INDEPENDENT_AMBULATORY_CARE_PROVIDER_SITE_OTHER): Payer: 59 | Admitting: Family Medicine

## 2023-10-17 ENCOUNTER — Encounter: Payer: Self-pay | Admitting: Family Medicine

## 2023-10-17 ENCOUNTER — Other Ambulatory Visit (HOSPITAL_BASED_OUTPATIENT_CLINIC_OR_DEPARTMENT_OTHER): Payer: Self-pay

## 2023-10-17 VITALS — BP 126/74 | HR 84 | Ht 63.0 in | Wt 227.0 lb

## 2023-10-17 DIAGNOSIS — R1013 Epigastric pain: Secondary | ICD-10-CM

## 2023-10-17 DIAGNOSIS — R350 Frequency of micturition: Secondary | ICD-10-CM

## 2023-10-17 DIAGNOSIS — B3731 Acute candidiasis of vulva and vagina: Secondary | ICD-10-CM | POA: Diagnosis not present

## 2023-10-17 LAB — POCT URINALYSIS DIP (CLINITEK)
Bilirubin, UA: NEGATIVE
Glucose, UA: NEGATIVE mg/dL
Ketones, POC UA: NEGATIVE mg/dL
Leukocytes, UA: NEGATIVE
Nitrite, UA: NEGATIVE
POC PROTEIN,UA: NEGATIVE
Spec Grav, UA: 1.025 (ref 1.010–1.025)
Urobilinogen, UA: 0.2 U/dL
pH, UA: 6 (ref 5.0–8.0)

## 2023-10-17 MED ORDER — FLUCONAZOLE 150 MG PO TABS
150.0000 mg | ORAL_TABLET | ORAL | 0 refills | Status: DC
  Filled 2023-10-17 – 2023-10-20 (×2): qty 6, 42d supply, fill #0

## 2023-10-17 MED ORDER — FLUCONAZOLE 150 MG PO TABS: 150.0000 mg | ORAL_TABLET | ORAL | 0 refills | Status: DC

## 2023-10-17 MED ORDER — FLUCONAZOLE 150 MG PO TABS: 150.0000 mg | ORAL_TABLET | ORAL | 1 refills | Status: DC

## 2023-10-17 NOTE — Assessment & Plan Note (Signed)
 Is planning on getting back in with GI as the pantoprazole does not seem to be helping.

## 2023-10-17 NOTE — Progress Notes (Signed)
 Acute Office Visit  Subjective:     Patient ID: Beth Holmes, female    DOB: 01-01-2001, 23 y.o.   MRN: [id number]  Chief Complaint  Patient presents with   Urinary Tract Infection   Vaginitis    HPI Patient is in today for lower pelvic pain and back pain as well as some tenderness rawness to the vaginal area and swelling.  She says this has been recurrent for years she has been battling frequent yeast infections over the years.  She says she always feels just like that vaginal area is always very raw and irritated.  She says she does not use any soaps or chemicals in that area.  She does not understand why she keeps getting it.  Her A1c is normal so she is not diabetic or prediabetic she does have a history of endometriosis and PCOS.  I have  Lab Results  Component Value Date   HGBA1C 5.4 05/20/2023   She is also been struggling with joint pain particularly in her back and her hip.  Some recent labs that were negative for rheumatoid lupus.  She did have a lumbar spine film in November that was normal.  He does have an IUD in place.  Been having more GERD and reflux recently but plans on getting back in with her GI she had an upper endoscopy not too long ago and currently takes pantoprazole  but feels like it really does not help.  ROS      Objective:    BP 124/85   Pulse (!) 59   Ht 5' 3 (1.6 m)   Wt 201 lb (91.2 kg)   SpO2 100%   BMI 35.61 kg/m    Physical Exam Vitals reviewed.  Constitutional:      Appearance: Normal appearance.  HENT:     Head: Normocephalic.  Pulmonary:     Effort: Pulmonary effort is normal.  Neurological:     Mental Status: She is alert and oriented to person, place, and time.  Psychiatric:        Mood and Affect: Mood normal.        Behavior: Behavior normal.     No results found for any visits on 10/17/23.      Assessment & Plan:   Problem List Items Addressed This Visit       Other   Epigastric pain   Is planning on  getting back in with GI as the pantoprazole  does not seem to be helping.      Other Visit Diagnoses       Urinary frequency    -  Primary   Relevant Orders   POCT URINALYSIS DIP (CLINITEK)   Urine Culture   NuSwab Vaginitis Plus (VG+)     Yeast vaginitis       Relevant Medications   fluconazole  (DIFLUCAN ) 150 MG tablet   fluconazole  (DIFLUCAN ) 150 MG tablet       Urinary frequency and pelvic pain-urinalysis is normal today so we will send for culture.  Yeast vaginitis will treat with Diflucan  since this is a recurring issue some to have her take a tab every other day for 3 doses and then after that take 1 tab weekly for 6 months to try to suppress any recurrence like for her to come back and see her PCP in about 2 months to make sure that she is improving and doing well on the prophylactic regimen.  Meds ordered this encounter  Medications   fluconazole  (  DIFLUCAN ) 150 MG tablet    Sig: Take 1 tablet (150 mg total) by mouth every other day.    Dispense:  3 tablet    Refill:  0   DISCONTD: fluconazole  (DIFLUCAN ) 150 MG tablet    Sig: Take 1 tablet (150 mg total) by mouth once a week. Please start in 2 weeks    Dispense:  6 tablet    Refill:  0   fluconazole  (DIFLUCAN ) 150 MG tablet    Sig: Take 1 tablet (150 mg total) by mouth once a week. Please start in 2 weeks    Dispense:  12 tablet    Refill:  1    Pls cancel rx for 6 tabs.    Return in about 2 months (around 12/15/2023) for f/u vaginitis .  Dorothyann Byars, MD

## 2023-10-19 LAB — URINE CULTURE

## 2023-10-20 ENCOUNTER — Encounter: Payer: Self-pay | Admitting: Family Medicine

## 2023-10-20 ENCOUNTER — Other Ambulatory Visit (HOSPITAL_BASED_OUTPATIENT_CLINIC_OR_DEPARTMENT_OTHER): Payer: Self-pay

## 2023-10-20 ENCOUNTER — Telehealth: Payer: Self-pay | Admitting: *Deleted

## 2023-10-20 LAB — NUSWAB VAGINITIS PLUS (VG+)
Candida albicans, NAA: NEGATIVE
Candida glabrata, NAA: NEGATIVE
Chlamydia trachomatis, NAA: NEGATIVE
Neisseria gonorrhoeae, NAA: NEGATIVE
Trich vag by NAA: NEGATIVE

## 2023-10-20 NOTE — Progress Notes (Signed)
 HI Beth Holmes, negative for yeast, or bacterial vaginitis, negative for gonorrhea or chlamydia or trichomonas.

## 2023-10-20 NOTE — Telephone Encounter (Signed)
 Returned call from 3:06 PM. No answer or voicemail to leave a message.

## 2023-10-21 ENCOUNTER — Other Ambulatory Visit: Payer: Self-pay

## 2023-10-21 ENCOUNTER — Other Ambulatory Visit (HOSPITAL_BASED_OUTPATIENT_CLINIC_OR_DEPARTMENT_OTHER): Payer: Self-pay

## 2023-10-21 MED ORDER — NORETHINDRONE ACETATE 5 MG PO TABS
5.0000 mg | ORAL_TABLET | Freq: Every day | ORAL | 11 refills | Status: DC
  Filled 2023-10-21: qty 30, 30d supply, fill #0
  Filled 2023-12-29: qty 30, 30d supply, fill #1

## 2023-10-21 NOTE — Telephone Encounter (Signed)
 Best option would be for her to schedule follow-up with her PCP.  I do think endometriosis is certainly a consideration but that would have to be diagnosed with a scope by an OB/GYN.  Does she have an OB/GYN?  We can always get her in there or get her back in with [facility].

## 2023-11-21 ENCOUNTER — Encounter: Payer: Self-pay | Admitting: Medical-Surgical

## 2023-11-21 ENCOUNTER — Other Ambulatory Visit (HOSPITAL_BASED_OUTPATIENT_CLINIC_OR_DEPARTMENT_OTHER): Payer: Self-pay

## 2023-11-21 ENCOUNTER — Ambulatory Visit (INDEPENDENT_AMBULATORY_CARE_PROVIDER_SITE_OTHER): Payer: 59 | Admitting: Medical-Surgical

## 2023-11-21 VITALS — BP 102/67 | HR 72 | Resp 20 | Ht 63.0 in | Wt 228.9 lb

## 2023-11-21 DIAGNOSIS — R350 Frequency of micturition: Secondary | ICD-10-CM | POA: Diagnosis not present

## 2023-11-21 DIAGNOSIS — L03032 Cellulitis of left toe: Secondary | ICD-10-CM

## 2023-11-21 DIAGNOSIS — N941 Unspecified dyspareunia: Secondary | ICD-10-CM | POA: Diagnosis not present

## 2023-11-21 DIAGNOSIS — N898 Other specified noninflammatory disorders of vagina: Secondary | ICD-10-CM | POA: Diagnosis not present

## 2023-11-21 LAB — POCT URINALYSIS DIP (CLINITEK)
Bilirubin, UA: NEGATIVE
Blood, UA: NEGATIVE
Glucose, UA: NEGATIVE mg/dL
Ketones, POC UA: NEGATIVE mg/dL
Leukocytes, UA: NEGATIVE
Nitrite, UA: NEGATIVE
POC PROTEIN,UA: NEGATIVE
Spec Grav, UA: 1.025 (ref 1.010–1.025)
Urobilinogen, UA: 0.2 U/dL
pH, UA: 5.5 (ref 5.0–8.0)

## 2023-11-21 MED ORDER — DOXYCYCLINE HYCLATE 100 MG PO TABS
100.0000 mg | ORAL_TABLET | Freq: Two times a day (BID) | ORAL | 0 refills | Status: DC
  Filled 2023-11-21: qty 14, 7d supply, fill #0

## 2023-11-21 MED ORDER — OXYBUTYNIN CHLORIDE ER 5 MG PO TB24
5.0000 mg | ORAL_TABLET | Freq: Every day | ORAL | 3 refills | Status: DC
  Filled 2023-11-21: qty 90, 90d supply, fill #0

## 2023-11-21 NOTE — Progress Notes (Signed)
        Established patient visit  History, exam, impression, and plan:  1. Paronychia of third toe of left foot (Primary) Pleasant 23 year old female presenting today with reports of an infected toe/toenail.  About a week ago, she was putting on a sock and the toenail on her left middle toe broke off.  Shortly after the area became red and irritated before swelling and becoming very uncomfortable.  Over the last several days, it has been leaking purulent discharge that she attempts to push out twice a day as it builds up.  On evaluation, the toenail has obviously been broken off into the nail fold and there is dark solidified discharge present.  There is erythema around all nail borders as well as extending back to the DIP joint of the toe.  No fluctuance noted at this time.  Discussed conservative measures at home with daily site care and Epsom salt soaks.  Adding doxycycline  100 mg twice daily. - doxycycline  (VIBRA -TABS) 100 MG tablet; Take 1 tablet (100 mg total) by mouth 2 (two) times daily.  Dispense: 14 tablet; Refill: 0  2. Urinary frequency Has had several visits now for urinary frequency suspecting UTI however no infection is present.  She returns today with frequency of urination, going about once an hour.  Has urgency and if she does not go, she develops low back pain and pelvic pain.  Not passing much urine each time and has not noticed any hematuria.  No urinary odor.  No vaginal symptoms.  POCT urinalysis completely normal.  Sending wet prep for further evaluation.  She does have an OB/GYN as well as a gynecologist in [facility] however has not been able to get in with her regular provider there recently.  She was told she was going to be referred to a urologist however has not heard anything from such a referral.  With her complicated history of vaginal irritation, dyspareunia, and urinary symptoms, plan to refer her to urogynecology.  In the meantime, we will try a low-dose of Ditropan  XL  at 5 mg nightly to see if this helps with overactive bladder symptoms. - POCT URINALYSIS DIP (CLINITEK) - WET PREP FOR TRICH, YEAST, CLUE - Ambulatory referral to Urogynecology - oxybutynin  (DITROPAN -XL) 5 MG 24 hr tablet; Take 1 tablet (5 mg total) by mouth at bedtime.  Dispense: 90 tablet; Refill: 3  3. Vaginal irritation See above - Ambulatory referral to Urogynecology  4. Dyspareunia in female See above - Ambulatory referral to Urogynecology   Procedures performed this visit: None.  Return in about 4 weeks (around 12/19/2023) for OAB follow up.  __________________________________ Beth FREDRIK Palin, DNP, APRN, FNP-BC Primary Care and Sports Medicine [facility] [place]

## 2023-11-22 ENCOUNTER — Encounter: Payer: Self-pay | Admitting: Medical-Surgical

## 2023-11-22 LAB — WET PREP FOR TRICH, YEAST, CLUE
Clue Cell Exam: NEGATIVE
Trichomonas Exam: NEGATIVE
Yeast Exam: NEGATIVE

## 2023-11-27 ENCOUNTER — Other Ambulatory Visit (HOSPITAL_BASED_OUTPATIENT_CLINIC_OR_DEPARTMENT_OTHER): Payer: Self-pay

## 2023-11-27 MED ORDER — ONDANSETRON 8 MG PO TBDP
8.0000 mg | ORAL_TABLET | Freq: Three times a day (TID) | ORAL | 0 refills | Status: DC
  Filled 2023-11-27: qty 15, 5d supply, fill #0

## 2023-12-15 ENCOUNTER — Ambulatory Visit: Payer: 59 | Admitting: Medical-Surgical

## 2023-12-19 ENCOUNTER — Ambulatory Visit: Payer: 59 | Admitting: Medical-Surgical

## 2023-12-19 NOTE — Progress Notes (Deleted)
   Established patient visit  History, exam, impression, and plan:  No problem-specific Assessment & Plan notes found for this encounter.   ROS  Physical Exam  Procedures performed this visit: None.  No follow-ups on file.  __________________________________ [name], DNP, APRN, FNP-BC Primary Care and Sports Medicine [facility] [place]

## 2023-12-29 ENCOUNTER — Other Ambulatory Visit: Payer: Self-pay | Admitting: Medical-Surgical

## 2023-12-29 ENCOUNTER — Other Ambulatory Visit (HOSPITAL_BASED_OUTPATIENT_CLINIC_OR_DEPARTMENT_OTHER): Payer: Self-pay

## 2023-12-29 DIAGNOSIS — R1013 Epigastric pain: Secondary | ICD-10-CM

## 2023-12-30 ENCOUNTER — Other Ambulatory Visit: Payer: Self-pay

## 2023-12-31 ENCOUNTER — Other Ambulatory Visit (HOSPITAL_BASED_OUTPATIENT_CLINIC_OR_DEPARTMENT_OTHER): Payer: Self-pay

## 2023-12-31 MED ORDER — PANTOPRAZOLE SODIUM 40 MG PO TBEC
40.0000 mg | DELAYED_RELEASE_TABLET | Freq: Two times a day (BID) | ORAL | 0 refills | Status: DC
  Filled 2023-12-31 (×2): qty 180, 90d supply, fill #0

## 2024-01-01 ENCOUNTER — Encounter (HOSPITAL_BASED_OUTPATIENT_CLINIC_OR_DEPARTMENT_OTHER): Payer: Self-pay

## 2024-01-01 ENCOUNTER — Emergency Department (HOSPITAL_BASED_OUTPATIENT_CLINIC_OR_DEPARTMENT_OTHER)
Admission: EM | Admit: 2024-01-01 | Discharge: 2024-01-01 | Disposition: A | Discharge status: Home / Self Care | Attending: Emergency Medicine | Admitting: Emergency Medicine

## 2024-01-01 ENCOUNTER — Other Ambulatory Visit (HOSPITAL_BASED_OUTPATIENT_CLINIC_OR_DEPARTMENT_OTHER): Payer: Self-pay

## 2024-01-01 ENCOUNTER — Other Ambulatory Visit: Payer: Self-pay

## 2024-01-01 DIAGNOSIS — G43809 Other migraine, not intractable, without status migrainosus: Secondary | ICD-10-CM | POA: Diagnosis not present

## 2024-01-01 DIAGNOSIS — R1013 Epigastric pain: Secondary | ICD-10-CM | POA: Diagnosis not present

## 2024-01-01 DIAGNOSIS — R519 Headache, unspecified: Secondary | ICD-10-CM | POA: Diagnosis present

## 2024-01-01 LAB — URINALYSIS, W/ REFLEX TO CULTURE (INFECTION SUSPECTED)
Bilirubin Urine: NEGATIVE
Glucose, UA: NEGATIVE mg/dL
Hgb urine dipstick: NEGATIVE
Ketones, ur: NEGATIVE mg/dL
Leukocytes,Ua: NEGATIVE
Nitrite: NEGATIVE
Specific Gravity, Urine: 1.031 — ABNORMAL HIGH (ref 1.005–1.030)
pH: 5.5 (ref 5.0–8.0)

## 2024-01-01 LAB — CBC WITH DIFFERENTIAL/PLATELET
Abs Immature Granulocytes: 0.03 10*3/uL (ref 0.00–0.07)
Basophils Absolute: 0 10*3/uL (ref 0.0–0.1)
Basophils Relative: 0 %
Eosinophils Absolute: 0.1 10*3/uL (ref 0.0–0.5)
Eosinophils Relative: 1 %
HCT: 40.1 % (ref 36.0–46.0)
Hemoglobin: 13.2 g/dL (ref 12.0–15.0)
Immature Granulocytes: 0 %
Lymphocytes Relative: 36 %
Lymphs Abs: 3.8 10*3/uL (ref 0.7–4.0)
MCH: 26.2 pg (ref 26.0–34.0)
MCHC: 32.9 g/dL (ref 30.0–36.0)
MCV: 79.6 fL — ABNORMAL LOW (ref 80.0–100.0)
Monocytes Absolute: 0.8 10*3/uL (ref 0.1–1.0)
Monocytes Relative: 7 %
Neutro Abs: 5.8 10*3/uL (ref 1.7–7.7)
Neutrophils Relative %: 56 %
Platelets: 204 10*3/uL (ref 150–400)
RBC: 5.04 MIL/uL (ref 3.87–5.11)
RDW: 13.1 % (ref 11.5–15.5)
WBC: 10.6 10*3/uL — ABNORMAL HIGH (ref 4.0–10.5)
nRBC: 0 % (ref 0.0–0.2)

## 2024-01-01 LAB — COMPREHENSIVE METABOLIC PANEL
ALT: 43 U/L (ref 0–44)
AST: 23 U/L (ref 15–41)
Albumin: 4.1 g/dL (ref 3.5–5.0)
Alkaline Phosphatase: 57 U/L (ref 38–126)
Anion gap: 7 (ref 5–15)
BUN: 9 mg/dL (ref 6–20)
CO2: 26 mmol/L (ref 22–32)
Calcium: 9.1 mg/dL (ref 8.9–10.3)
Chloride: 104 mmol/L (ref 98–111)
Creatinine, Ser: 0.65 mg/dL (ref 0.44–1.00)
GFR, Estimated: >60 mL/min (ref >60)
Glucose, Bld: 87 mg/dL (ref 70–99)
Potassium: 3.7 mmol/L (ref 3.5–5.1)
Sodium: 137 mmol/L (ref 135–145)
Total Bilirubin: 0.5 mg/dL (ref 0.0–1.2)
Total Protein: 6.4 g/dL — ABNORMAL LOW (ref 6.5–8.1)

## 2024-01-01 LAB — PREGNANCY, URINE: Preg Test, Ur: NEGATIVE

## 2024-01-01 LAB — LIPASE, BLOOD: Lipase: 18 U/L (ref 11–51)

## 2024-01-01 MED ORDER — PANTOPRAZOLE SODIUM 40 MG PO TBEC: 40.0000 mg | DELAYED_RELEASE_TABLET | Freq: Two times a day (BID) | ORAL | Status: DC

## 2024-01-01 MED ORDER — FAMOTIDINE 20 MG PO TABS
20.0000 mg | ORAL_TABLET | Freq: Two times a day (BID) | ORAL | 0 refills | Status: DC
  Filled 2024-01-01: qty 14, 7d supply, fill #0

## 2024-01-01 MED ORDER — DEXAMETHASONE SODIUM PHOSPHATE 10 MG/ML IJ SOLN
10.0000 mg | Freq: Once | INTRAMUSCULAR | Status: AC
  Administered 2024-01-01: 10 mg via INTRAVENOUS
  Filled 2024-01-01: qty 1

## 2024-01-01 MED ORDER — DIPHENHYDRAMINE HCL 50 MG/ML IJ SOLN
25.0000 mg | Freq: Once | INTRAMUSCULAR | Status: AC
  Administered 2024-01-01: 25 mg via INTRAVENOUS
  Filled 2024-01-01: qty 1

## 2024-01-01 MED ORDER — PROCHLORPERAZINE EDISYLATE 10 MG/2ML IJ SOLN
10.0000 mg | Freq: Once | INTRAMUSCULAR | Status: AC
Start: 2024-01-01 — End: 2024-01-01
  Administered 2024-01-01: 10 mg via INTRAVENOUS
  Filled 2024-01-01: qty 2

## 2024-01-01 MED ORDER — SUCRALFATE 1 G PO TABS: 1.0000 g | ORAL_TABLET | Freq: Three times a day (TID) | ORAL | 0 refills | Status: DC

## 2024-01-01 MED ORDER — PANTOPRAZOLE SODIUM 40 MG IV SOLR
40.0000 mg | Freq: Once | INTRAVENOUS | Status: AC
  Administered 2024-01-01: 40 mg via INTRAVENOUS
  Filled 2024-01-01: qty 10

## 2024-01-01 NOTE — ED Triage Notes (Signed)
 Pt reports she is here today due to abd pain. Pt reports it started x1 day ago.Pt reports nausea. Pt states she is also having a headache, lower back pain.

## 2024-01-01 NOTE — ED Provider Notes (Signed)
 [place] EMERGENCY DEPARTMENT AT [facility] Provider Note   CSN: [id number] Arrival date & time: 01/01/24  0355     History  Chief Complaint  Patient presents with   Abdominal Pain    Beth Holmes is a 23 y.o. female.  23 year old female with epigastric abdominal pain that radiates to her back.  Feels similar to previous ulcers that she has had.  She is on proton pump inhibitor at home and some other over-the-counter type medication.  She states that she has had a couple episodes of emesis with this.  She states that her headache has come back as well.  She states it is frontal radiation was the top of her head and does have some photophobia.  She has a history of headaches like this.  She also has a blurry vision.  She states that they have become a bit more frequent.  She had MRI in July which was normal for the same symptoms.  She does not see a neurologist at this time.   Abdominal Pain      Home Medications Prior to Admission medications   Medication Sig Start Date End Date Taking? Authorizing Provider  famotidine (PEPCID) 20 MG tablet Take 1 tablet (20 mg total) by mouth 2 (two) times daily for 7 days. 01/01/24 01/08/24 Yes Makaelyn Aponte, Barbara Cower, MD  sucralfate (CARAFATE) 1 g tablet Take 1 tablet (1 g total) by mouth 4 (four) times daily -  with meals and at bedtime for 7 days. 01/01/24 01/08/24 Yes Yurani Fettes, Barbara Cower, MD  albuterol (VENTOLIN HFA) 108 (90 Base) MCG/ACT inhaler Inhale 2 puffs into the lungs every 6 (six) hours as needed for wheezing. 08/27/21   Christen Butter, NP  clotrimazole-betamethasone (LOTRISONE) cream Apply 1 Application topically daily. 05/20/23   Christen Butter, NP  fluconazole (DIFLUCAN) 150 MG tablet Take 1 tablet (150 mg total) by mouth once a week. Please start in 2 weeks 10/17/23   Agapito Games, MD  ibuprofen (ADVIL,MOTRIN) 200 MG tablet Take 600 mg by mouth every 6 (six) hours as needed for mild pain.    [provider]  levonorgestrel  (MIRENA) 20 MCG/DAY IUD 1 each by Intrauterine route once.    [provider]  metoprolol succinate (TOPROL-XL) 25 MG 24 hr tablet Take 1 tablet (25 mg total) by mouth daily. 06/12/23   Christen Butter, NP  montelukast (SINGULAIR) 10 MG tablet Take 1 tablet (10 mg total) by mouth at bedtime. 05/01/23   Christen Butter, NP  norethindrone (AYGESTIN) 5 MG tablet Take 1 tablet (5 mg total) by mouth daily. 10/21/23     ondansetron (ZOFRAN-ODT) 8 MG disintegrating tablet Take 1 tablet (8 mg total) by mouth every 8 (eight) hours as needed for nausea. 11/27/22   Breeback, Jade L, PA-C  ondansetron (ZOFRAN-ODT) 8 MG disintegrating tablet Take 1 tablet (8 mg total) by mouth every 8 (eight) hours for nausea and vomiting, for 5 days. 11/26/23     oxybutynin (DITROPAN-XL) 5 MG 24 hr tablet Take 1 tablet (5 mg total) by mouth at bedtime. 11/21/23   Christen Butter, NP  pantoprazole (PROTONIX) 40 MG tablet Take 1 tablet (40 mg total) by mouth 2 (two) times daily before a meal for 10 days. NEEDS APPOINTMENT FOR FURTHER REFILLS. 01/01/24 01/11/24  Cheetara Hoge, Barbara Cower, MD  rizatriptan (MAXALT-MLT) 5 MG disintegrating tablet Take 1 tablet (5 mg total) by mouth as needed for migraine. May repeat in 2 hours if needed 05/01/23   Christen Butter, NP  valACYclovir (  VALTREX) 500 MG tablet Take 1 tablet (500 mg total) by mouth 2 (two) times daily as needed. 07/10/23   Christen Butter, NP  venlafaxine XR (EFFEXOR XR) 150 MG 24 hr capsule Take 1 capsule (150 mg total) by mouth daily with breakfast. 09/02/23   Christen Butter, NP      Allergies    Patient has no allergy information on record.    Review of Systems   Review of Systems  Gastrointestinal:  Positive for abdominal pain.    Physical Exam Updated Vital Signs BP 119/74   Pulse 89   Temp 98 F (36.7 C) (Oral)   Resp 18   Ht 5\' 2"  (1.575 m)   Wt 97.5 kg   SpO2 97%   BMI 39.32 kg/m  Physical Exam Vitals and nursing note reviewed.  Constitutional:      Appearance: She is well-developed.   HENT:     Head: Normocephalic and atraumatic.  Cardiovascular:     Rate and Rhythm: Normal rate and regular rhythm.  Pulmonary:     Effort: No respiratory distress.     Breath sounds: No stridor.  Abdominal:     General: There is no distension.     Tenderness: There is abdominal tenderness in the epigastric area.  Musculoskeletal:     Cervical back: Normal range of motion.  Skin:    General: Skin is warm and dry.  Neurological:     General: No focal deficit present.     Mental Status: She is alert.     Comments: No altered mental status, able to give full seemingly accurate history.  Face is symmetric, EOM's intact, pupils equal and reactive, vision intact, tongue and uvula midline without deviation. Upper and Lower extremity motor 5/5, intact pain perception in distal extremities, 2+ reflexes in biceps, patella and achilles tendons. Able to perform finger to nose normal with both hands. Walks without assistance or evident ataxia.       ED Results / Procedures / Treatments   Labs (all labs ordered are listed, but only abnormal results are displayed) Labs Reviewed  CBC WITH DIFFERENTIAL/PLATELET - Abnormal; Notable for the following components:      Result Value   WBC 10.6 (*)    MCV 79.6 (*)    All other components within normal limits  COMPREHENSIVE METABOLIC PANEL - Abnormal; Notable for the following components:   Total Protein 6.4 (*)    All other components within normal limits  URINALYSIS, W/ REFLEX TO CULTURE (INFECTION SUSPECTED) - Abnormal; Notable for the following components:   Specific Gravity, Urine 1.031 (*)    Protein, ur TRACE (*)    Bacteria, UA RARE (*)    All other components within normal limits  LIPASE, BLOOD  PREGNANCY, URINE    EKG None  Radiology No results found.  Procedures Procedures    Medications Ordered in ED Medications  pantoprazole (PROTONIX) injection 40 mg (40 mg Intravenous Given 01/01/24 0521)  prochlorperazine  (COMPAZINE) injection 10 mg (10 mg Intravenous Given 01/01/24 0521)  diphenhydrAMINE (BENADRYL) injection 25 mg (25 mg Intravenous Given 01/01/24 0522)  dexamethasone (DECADRON) injection 10 mg (10 mg Intravenous Given 01/01/24 0522)    ED Course/ Medical Decision Making/ A&P                                 Medical Decision Making Amount and/or Complexity of Data Reviewed Labs: ordered.  Risk Prescription drug  management.   SPECT recurrent ulcer versus gastritis versus esophagitis as far as her pain and vomiting go.  Labs are reassuring.  Symptoms improved medications here.  No indication for imaging there.  Will increase her at home and acid regimen for the next week or so recommend GI follow-up if not improving.  The headache seems similar to previous and improved with a headache cocktail.  She will follow-up with neurology, referral placed.  Low suspicion for IIH, meningitis, bleed or other acute etiology.  No indication for imaging at this time.  Stable for discharge.  Final Clinical Impression(s) / ED Diagnoses Final diagnoses:  Epigastric pain  Other migraine without status migrainosus, not intractable    Rx / DC Orders ED Discharge Orders          Ordered    pantoprazole (PROTONIX) 40 MG tablet  2 times daily before meals        01/01/24 0628    famotidine (PEPCID) 20 MG tablet  2 times daily        01/01/24 0628    sucralfate (CARAFATE) 1 g tablet  3 times daily with meals & bedtime        01/01/24 9147    Ambulatory referral to Neurology       Comments: An appointment is requested in approximately: 8 weeks   01/01/24 [id number]              Marily Memos, MD 01/01/24 [phone]

## 2024-01-08 ENCOUNTER — Encounter: Payer: Self-pay | Admitting: Medical-Surgical

## 2024-01-08 ENCOUNTER — Other Ambulatory Visit (HOSPITAL_BASED_OUTPATIENT_CLINIC_OR_DEPARTMENT_OTHER): Payer: Self-pay

## 2024-01-08 ENCOUNTER — Ambulatory Visit (INDEPENDENT_AMBULATORY_CARE_PROVIDER_SITE_OTHER): Admitting: Medical-Surgical

## 2024-01-08 VITALS — BP 96/68 | HR 85 | Resp 20 | Ht 62.0 in | Wt 217.8 lb

## 2024-01-08 DIAGNOSIS — R102 Pelvic and perineal pain: Secondary | ICD-10-CM | POA: Diagnosis not present

## 2024-01-08 DIAGNOSIS — R1013 Epigastric pain: Secondary | ICD-10-CM | POA: Diagnosis not present

## 2024-01-08 DIAGNOSIS — Z09 Encounter for follow-up examination after completed treatment for conditions other than malignant neoplasm: Secondary | ICD-10-CM

## 2024-01-08 DIAGNOSIS — K921 Melena: Secondary | ICD-10-CM

## 2024-01-08 DIAGNOSIS — K120 Recurrent oral aphthae: Secondary | ICD-10-CM

## 2024-01-08 DIAGNOSIS — R399 Unspecified symptoms and signs involving the genitourinary system: Secondary | ICD-10-CM | POA: Diagnosis not present

## 2024-01-08 DIAGNOSIS — F419 Anxiety disorder, unspecified: Secondary | ICD-10-CM

## 2024-01-08 MED ORDER — VALACYCLOVIR HCL 500 MG PO TABS
500.0000 mg | ORAL_TABLET | Freq: Two times a day (BID) | ORAL | 1 refills | Status: AC | PRN
  Filled 2024-01-08: qty 60, 30d supply, fill #0
  Filled 2024-03-26: qty 60, 30d supply, fill #1
  Filled 2024-06-11: qty 60, 30d supply, fill #2
  Filled 2024-10-29: qty 60, 30d supply, fill #3

## 2024-01-08 MED ORDER — VENLAFAXINE HCL ER 150 MG PO CP24
150.0000 mg | ORAL_CAPSULE | Freq: Every day | ORAL | 1 refills | Status: DC
  Filled 2024-01-08: qty 90, 90d supply, fill #0
  Filled 2024-04-19 – 2024-05-14 (×2): qty 90, 90d supply, fill #1

## 2024-01-08 MED ORDER — SUCRALFATE 1 G PO TABS
1.0000 g | ORAL_TABLET | Freq: Three times a day (TID) | ORAL | 0 refills | Status: DC
  Filled 2024-01-08: qty 120, 30d supply, fill #0

## 2024-01-08 NOTE — Progress Notes (Unsigned)
 Established patient visit  History, exam, impression, and plan:  1. Hospital discharge follow-up (Primary) Beth Holmes 23 year old female presenting today for hospital discharge follow-up after being seen in the ED on 01/01/2024 for epigastric abdominal pain.  At that time she had pain radiating to her back along with nausea, vomiting, headache, and bloating.  She was already on Protonix which she had been taking regularly.  She was discharged with a new prescription for famotidine 20 mg twice daily as an adjunct to the Protonix.  Stay notes that the Protonix and famotidine do not seem to be helping.  Continues to have episodes of nausea with vomiting.  Hematemesis noted once.  She has been using Pepto-Bismol which helps a little temporarily.  It has been a week since her last dose of that.  Over the last couple of days, she has had black stools.  Given the timeline, this may be a side effect of Pepto-Bismol but could also consider upper GI bleed.  Exam is benign with no areas of tenderness identified.  No distention or rebound tenderness.  Bowel sounds positive x 4 quadrants.  No abnormal paleness, lightheadedness, dizziness, or near syncopal symptoms.  Plan to add Carafate 4 times daily and refer to GI for further evaluation.  2. Pelvic pain 3. Urinary symptom or sign Has a long history of pelvic pain, dyspareunia, and suprapubic pain.  At times this causes debility as well as nausea and vomiting.  She has had irregular vaginal bleeding.  Anytime she lifts something heavy or has sexual intercourse, she has bleeding within hours.  She had a visit with OB/GYN who sent her to a specialist in [facility] for endometriosis.  They did a surgical procedure and then she was lost to follow-up.  She is concerned that there may be something else going on given her history of ovarian cysts.  Plan to update her pelvic ultrasound.  She does complain of a lot of urinary symptoms including burning, frequency,  urgency.  Has had multiple urinalysis evaluations but there has been no infection present.  She would like a referral to someone close by who works with endometriosis.  After discussion, feel she would benefit more from seeing urogynecology.  She is in agreement so referral placed. - Ambulatory referral to Urogynecology - US Pelvic Complete With Transvaginal; Future  4. Epigastric pain See above. - sucralfate (CARAFATE) 1 g tablet; Take 1 tablet (1 g total) by mouth 4 (four) times daily -  with meals and at bedtime.  Dispense: 120 tablet; Refill: 0 - Ambulatory referral to Gastroenterology  5. Black stools See above.  Referring to GI. 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  Continue Valtrex 500 mg twice daily. - valACYclovir (VALTREX) 500 MG tablet; Take 1 tablet (500 mg total) by mouth 2 (two) times daily as needed.  Dispense: 180 tablet; Refill: 1  7. Anxiety History of anxiety but seems to be doing very well on Effexor.  Denies SI/HI.  Mood, affect, speech, and thought pattern all normal today.  Continue Effexor 150 mg daily. - venlafaxine XR (EFFEXOR XR) 150 MG 24 hr capsule; Take 1 capsule (150 mg total) by mouth daily with breakfast.  Dispense: 90 capsule; Refill: 1   Procedures performed this visit: None.  Return if symptoms worsen or fail to improve.  __________________________________ [name], DNP, APRN, FNP-BC Primary Care and Sports Medicine  [facility]

## 2024-01-12 ENCOUNTER — Ambulatory Visit (INDEPENDENT_AMBULATORY_CARE_PROVIDER_SITE_OTHER)

## 2024-01-12 DIAGNOSIS — R399 Unspecified symptoms and signs involving the genitourinary system: Secondary | ICD-10-CM | POA: Diagnosis not present

## 2024-01-12 DIAGNOSIS — R102 Pelvic and perineal pain: Secondary | ICD-10-CM

## 2024-01-13 ENCOUNTER — Encounter: Payer: Self-pay | Admitting: Medical-Surgical

## 2024-01-13 DIAGNOSIS — N83299 Other ovarian cyst, unspecified side: Secondary | ICD-10-CM

## 2024-01-15 ENCOUNTER — Telehealth: Payer: Self-pay | Admitting: Gastroenterology

## 2024-01-15 NOTE — Telephone Encounter (Signed)
 Good morning Dr. Barron Alvine,   We received a referral for patient for Epigastric pain and black stools. Patient was last seen with you in 2021. Patient last saw [facility] in April of 2024 for a endoscopy and colonoscopy. Patient is requesting to transfer he care back to you due to being referred by her primary care provider. Also requesting to transfer due to being unable to be scheduled for an appointment that worked with her scheduled. Patient's previous records are in Epic for you to review and advise on scheduling.   Thank you.

## 2024-01-15 NOTE — Telephone Encounter (Signed)
 Patient has been following with [facility] with extensive evaluation within the last year.  I do not recommend continued transfers of her GI care, and I cannot accommodate this transfer.  Instead I recommend she reach out to [facility] to try to find an appointment that works within her schedule to continue her workup.

## 2024-01-19 NOTE — Telephone Encounter (Signed)
Called patient to advise. Left voicemail.

## 2024-01-26 ENCOUNTER — Ambulatory Visit: Payer: Self-pay

## 2024-01-26 NOTE — Telephone Encounter (Signed)
 Appt 01/27/24 with Cherre Cornish, NP

## 2024-01-26 NOTE — Telephone Encounter (Signed)
 Reason for Triage: Patient is currently at Urgent Care and was advised to contact her PCP to request lab work. UC provider is concerned that endometriosis may be contributing to recurrent infections affecting the kidneys. Patient reports a mild fever at this time. Please advise if labs can be ordered.  Patient callback [phone]   Chief Complaint: urination pain Symptoms: pain with urination, blood in urine, protein in urine, 10/10 back pain, 5/10 abd pain, fever Frequency: x couple of weeks and ongoing/worsening Pertinent Negatives: Patient denies n/v Disposition: [] ED /[] Urgent Care (no appt availability in office) / [x] Appointment(In office/virtual)/ []  [place] [facility]/ [] Home Care/ [] Refused Recommended Disposition /[] [place] [facility]/ []  Follow-up with PCP Additional Notes: pt would like to get some lab work done to check status of endometriosis.    Reason for Disposition  > 2 UTI's in last year  Answer Assessment - Initial Assessment Questions 1. SEVERITY: "How bad is the pain?"  (e.g., Scale 1-10; mild, moderate, or severe)   - MILD (1-3): complains slightly about urination hurting   - MODERATE (4-7): interferes with normal activities     - SEVERE (8-10): excruciating, unwilling or unable to urinate because of the pain      Moderate  2. FREQUENCY: "How many times have you had painful urination today?"      Each time 3. PATTERN: "Is pain present every time you urinate or just sometimes?"      yes 4. ONSET: "When did the painful urination start?"      X couple weeks 5. FEVER: "Do you have a fever?" If Yes, ask: "What is your temperature, how was it measured, and when did it start?"     yes 6. PAST UTI: "Have you had a urine infection before?" If Yes, ask: "When was the last time?" and "What happened that time?"      yes 7. CAUSE: "What do you think is causing the painful urination?"  (e.g., UTI, scratch, Herpes sore)     unknown 8. OTHER SYMPTOMS: "Do you have  any other symptoms?" (e.g., blood in urine, flank pain, genital sores, urgency, vaginal discharge)     10/10 Back pain, 5/10 abd pain, blood in urine, protein in urine 9. PREGNANCY: "Is there any chance you are pregnant?" "When was your last menstrual period?"     N/a  Protocols used: Urination Pain - Female-A-AH

## 2024-01-27 ENCOUNTER — Ambulatory Visit (INDEPENDENT_AMBULATORY_CARE_PROVIDER_SITE_OTHER): Admitting: Medical-Surgical

## 2024-01-27 ENCOUNTER — Encounter: Payer: Self-pay | Admitting: Medical-Surgical

## 2024-01-27 VITALS — BP 115/77 | HR 82 | Resp 20 | Ht 62.0 in | Wt 224.1 lb

## 2024-01-27 DIAGNOSIS — G8929 Other chronic pain: Secondary | ICD-10-CM

## 2024-01-27 DIAGNOSIS — R1084 Generalized abdominal pain: Secondary | ICD-10-CM | POA: Diagnosis not present

## 2024-01-27 DIAGNOSIS — M5442 Lumbago with sciatica, left side: Secondary | ICD-10-CM

## 2024-01-27 DIAGNOSIS — M5441 Lumbago with sciatica, right side: Secondary | ICD-10-CM | POA: Diagnosis not present

## 2024-01-27 DIAGNOSIS — R399 Unspecified symptoms and signs involving the genitourinary system: Secondary | ICD-10-CM

## 2024-01-27 LAB — POCT URINALYSIS DIP (CLINITEK)
Bilirubin, UA: NEGATIVE
Blood, UA: NEGATIVE
Glucose, UA: NEGATIVE mg/dL
Ketones, POC UA: NEGATIVE mg/dL
Leukocytes, UA: NEGATIVE
Nitrite, UA: NEGATIVE
POC PROTEIN,UA: NEGATIVE
Spec Grav, UA: 1.025 (ref 1.010–1.025)
Urobilinogen, UA: 0.2 U/dL
pH, UA: 6 (ref 5.0–8.0)

## 2024-01-27 MED ORDER — CYCLOBENZAPRINE HCL 10 MG PO TABS
5.0000 mg | ORAL_TABLET | Freq: Three times a day (TID) | ORAL | 0 refills | Status: DC | PRN
Start: 2024-01-27 — End: 2024-06-23

## 2024-01-27 NOTE — Progress Notes (Signed)
 Called office,they claimed they called but had no luck with phone number. If she would like to give them a call [phone].

## 2024-01-27 NOTE — Progress Notes (Signed)
        Established patient visit  History, exam, impression, and plan:  1. Chronic bilateral low back pain with sciatica, sciatica laterality unspecified (Primary) Pleasant 23 year old female presenting today for follow-up after being seen at urgent care yesterday.  She has been having some chronic neck bilateral low back pain with sciatica that shoots down to her right hip and buttock.  Notes a little midline tenderness but it is mostly affected axially, left worse than right today.  Notes this has been a chronic issue and she has always been worried about a kidney infection.  Currently on prednisone however this is not helping with her back pain.  On evaluation, tenderness along the lower thoracic and lumbar spine, mild at midline and worsening towards the right hip.  Suspect this is multifactorial with limited physical exercise, obesity, and poor posture.  Plan to check labs today.  Referring to physical therapy.  Discussed Tylenol and topical analgesics. - Ambulatory referral to Physical Therapy - Sed Rate (ESR) - C-reactive protein - ANA Direct w/Reflex if Positive  2. Urinary tract infection symptoms At urgent care yesterday, reported that she had burning with urination.  The urinalysis completed there showed elevated specific gravity but negative for nitrites and leukocytes.  Has been having recurrent urinary issues but feels they are getting more frequent and more severe as time goes on.  Has been referred to urogynecology by 3 different providers but has not heard anything from any of them yet.  Today repeat urinalysis is completely normal.  No culture necessary.  Patient advised that this was likely related to dehydration and the increase in fluids from yesterday to today helped.  Per the urgent care provider, she recommended lab work and stated she thought the recurrent kidney infections were due to endometriosis.  Although her urine looks good, she is still very concerned that there may be  something wrong so we will plan to check labs as below. - CBC with Differential/Platelet - CMP14+EGFR - TSH - Sed Rate (ESR) - C-reactive protein - ANA Direct w/Reflex if Positive - POCT URINALYSIS DIP (CLINITEK)  3. Generalized abdominal pain She does have some issues with generalized lower abdominal pain.  She has had imaging as well as visited OB/GYN for pelvic and lower abdominal pain.  Checking labs as below which may give us  some more input.  Reached out to our referral team regarding the referral to urogynecology.  Direct number for patient to call sent via MyChart message to get scheduled. - CBC with Differential/Platelet - CMP14+EGFR - TSH Procedures performed this visit: None.  Return if symptoms worsen or fail to improve.  __________________________________ Maryl Snook, DNP, APRN, FNP-BC Primary Care and Sports Medicine [facility] [place]

## 2024-01-28 ENCOUNTER — Encounter: Payer: Self-pay | Admitting: Medical-Surgical

## 2024-01-28 LAB — TSH: TSH: 1.22 u[IU]/mL (ref 0.450–4.500)

## 2024-01-28 LAB — CMP14+EGFR
ALT: 26 IU/L (ref 0–32)
AST: 14 IU/L (ref 0–40)
Albumin: 3.8 g/dL — ABNORMAL LOW (ref 4.0–5.0)
Alkaline Phosphatase: 82 IU/L (ref 44–121)
BUN/Creatinine Ratio: 18 (ref 9–23)
BUN: 12 mg/dL (ref 6–20)
Bilirubin Total: <0.2 mg/dL (ref 0.0–1.2)
CO2: 20 mmol/L (ref 20–29)
Calcium: 8.7 mg/dL (ref 8.7–10.2)
Chloride: 101 mmol/L (ref 96–106)
Creatinine, Ser: 0.66 mg/dL (ref 0.57–1.00)
Globulin, Total: 2.4 g/dL (ref 1.5–4.5)
Glucose: 78 mg/dL (ref 70–99)
Potassium: 4 mmol/L (ref 3.5–5.2)
Sodium: 136 mmol/L (ref 134–144)
Total Protein: 6.2 g/dL (ref 6.0–8.5)
eGFR: 128 mL/min/{1.73_m2} (ref >59)

## 2024-01-28 LAB — CBC WITH DIFFERENTIAL/PLATELET
Basophils Absolute: 0.1 10*3/uL (ref 0.0–0.2)
Basos: 1 %
EOS (ABSOLUTE): 0.2 10*3/uL (ref 0.0–0.4)
Eos: 1 %
Hematocrit: 41 % (ref 34.0–46.6)
Hemoglobin: 13.4 g/dL (ref 11.1–15.9)
Immature Grans (Abs): 0 10*3/uL (ref 0.0–0.1)
Immature Granulocytes: 0 %
Lymphocytes Absolute: 5.8 10*3/uL — ABNORMAL HIGH (ref 0.7–3.1)
Lymphs: 52 %
MCH: 26.7 pg (ref 26.6–33.0)
MCHC: 32.7 g/dL (ref 31.5–35.7)
MCV: 82 fL (ref 79–97)
Monocytes Absolute: 0.8 10*3/uL (ref 0.1–0.9)
Monocytes: 7 %
Neutrophils Absolute: 4.3 10*3/uL (ref 1.4–7.0)
Neutrophils: 39 %
Platelets: 285 10*3/uL (ref 150–450)
RBC: 5.01 x10E6/uL (ref 3.77–5.28)
RDW: 14 % (ref 11.7–15.4)
WBC: 11.1 10*3/uL — ABNORMAL HIGH (ref 3.4–10.8)

## 2024-01-28 LAB — SEDIMENTATION RATE: Sed Rate: 20 mm/h (ref 0–32)

## 2024-01-28 LAB — ANA W/REFLEX IF POSITIVE: Anti Nuclear Antibody (ANA): NEGATIVE

## 2024-01-28 LAB — C-REACTIVE PROTEIN: CRP: 3 mg/L (ref 0–10)

## 2024-03-09 ENCOUNTER — Other Ambulatory Visit

## 2024-03-26 ENCOUNTER — Other Ambulatory Visit (HOSPITAL_BASED_OUTPATIENT_CLINIC_OR_DEPARTMENT_OTHER): Payer: Self-pay

## 2024-04-06 ENCOUNTER — Other Ambulatory Visit (HOSPITAL_BASED_OUTPATIENT_CLINIC_OR_DEPARTMENT_OTHER): Payer: Self-pay

## 2024-04-06 MED ORDER — NORETHIN ACE-ETH ESTRAD-FE 1-20 MG-MCG PO TABS
1.0000 | ORAL_TABLET | Freq: Every day | ORAL | 3 refills | Status: DC
  Filled 2024-04-06: qty 84, 84d supply, fill #0
  Filled 2024-07-02: qty 84, 84d supply, fill #1
  Filled 2024-09-22: qty 84, 84d supply, fill #2

## 2024-04-29 ENCOUNTER — Telehealth: Payer: Self-pay | Admitting: Neurology

## 2024-04-29 NOTE — Telephone Encounter (Signed)
 MYC conf

## 2024-04-30 ENCOUNTER — Other Ambulatory Visit (HOSPITAL_BASED_OUTPATIENT_CLINIC_OR_DEPARTMENT_OTHER): Payer: Self-pay

## 2024-05-03 ENCOUNTER — Ambulatory Visit: Admitting: Neurology

## 2024-05-03 ENCOUNTER — Encounter: Payer: Self-pay | Admitting: Neurology

## 2024-05-28 ENCOUNTER — Ambulatory Visit: Admitting: Obstetrics and Gynecology

## 2024-06-07 ENCOUNTER — Ambulatory Visit: Payer: Self-pay

## 2024-06-07 NOTE — Telephone Encounter (Signed)
 FYI Only or Action Required?: Action required by provider: Request medication dosage increase, appt made.  Patient was last seen in primary care on 01/27/2024 by Willo Mini, NP.  Called Nurse Triage reporting Panic Attack.  Symptoms began a week ago.  Interventions attempted: Prescription medications: effexor .  Symptoms are: gradually worsening.  Triage Disposition: See PCP When Office is Open (Within 3 Days)  Patient/caregiver understands and will follow disposition?: Yes     Copied from CRM #[id number]. Topic: Clinical - Red Word Triage >> Jun 07, 2024  4:59 PM Graeme ORN wrote: Red Word that prompted transfer to Nurse Triage: increased panic attacks to point where chest and head hurts, shaking. Reason for Disposition  MODERATE anxiety (e.g., persistent or frequent anxiety symptoms; interferes with sleep, school, or work)  Answer Assessment - Initial Assessment Questions Patient requests Effexor  medication increase, seems not be helping as much lately; having increased panic attacks.  Scheduled appt with provider.  Denies ha, chest pain, dizziness   1. CONCERN: Did anything happen that prompted you to call today?     Grandpa hospice and a lot going on making my anxityey worse  2. ANXIETY SYMPTOMS: Can you describe how you (your loved one; patient) have been feeling? (e.g., tense, restless, panicky, anxious, keyed up, overwhelmed, sense of impending doom).      Heart racing, calmy, chest hurting, head hurt; during panic attacks 3. ONSET: How long have you been feeling this way? (e.g., hours, days, weeks)     About 1 month, but getting increasling worse 4. SEVERITY: How would you rate the level of anxiety? (e.g., 0 - 10; or mild, moderate, severe).     8/10 5. FUNCTIONAL IMPAIRMENT: How have these feelings affected your ability to do daily activities? Have you had more difficulty than usual doing your normal daily activities? (e.g., getting better, same, worse;  self-care, school, work, interactions)     Affecting sleeping and eating(no appeitite to stress eating) 6. HISTORY: Have you felt this way before? Have you ever been diagnosed with an anxiety problem in the past? (e.g., generalized anxiety disorder, panic attacks, PTSD). If Yes, ask: How was this problem treated? (e.g., medicines, counseling, etc.)     Anxiety, panic attack 7. RISK OF HARM - SUICIDAL IDEATION: Do you ever have thoughts of hurting or killing yourself? If Yes, ask:  Do you have these feelings now? Do you have a plan on how you would do this?     no 8. TREATMENT:  What has been done so far to treat this anxiety? (e.g., medicines, relaxation strategies). What has helped?     effexor  9. THERAPIST: Do you have a counselor or therapist? If Yes, ask: What is their name?     no 10. POTENTIAL TRIGGERS: Do you drink caffeinated beverages (e.g., coffee, colas, teas), and how much daily? Do you drink alcohol or use any drugs? Have you started any new medicines recently?       Soda every now and then, no alcohol/drugs 11. PATIENT SUPPORT: Who is with you now? Who do you live with? Do you have family or friends who you can talk to?        Yes, mother 41. OTHER SYMPTOMS: Do you have any other symptoms? (e.g., feeling depressed, trouble concentrating, trouble sleeping, trouble breathing, palpitations or fast heartbeat, chest pain, sweating, nausea, or diarrhea)      Trouble concentratining,  Sleeping trouble, fast heart rate, chest pain, sweating and nauseas dring painic attack 13. PREGNANCY: Is there  any chance you are pregnant? When was your last menstrual period?       no  Protocols used: Anxiety and Panic Attack-A-AH

## 2024-06-11 ENCOUNTER — Ambulatory Visit: Admitting: Medical-Surgical

## 2024-06-11 ENCOUNTER — Other Ambulatory Visit: Payer: Self-pay

## 2024-06-11 ENCOUNTER — Other Ambulatory Visit: Payer: Self-pay | Admitting: Medical-Surgical

## 2024-06-11 ENCOUNTER — Other Ambulatory Visit (HOSPITAL_BASED_OUTPATIENT_CLINIC_OR_DEPARTMENT_OTHER): Payer: Self-pay

## 2024-06-11 DIAGNOSIS — G43909 Migraine, unspecified, not intractable, without status migrainosus: Secondary | ICD-10-CM

## 2024-06-11 NOTE — Progress Notes (Deleted)
        Established patient visit   History of Present Illness   Discussed the use of AI scribe software for clinical note transcription with the patient, who gave verbal consent to proceed.  History of Present Illness            Physical Exam   Physical Exam  Assessment & Plan   Assessment and Plan               Follow up   No follow-ups on file.  __________________________________ Beth FREDRIK Palin, DNP, APRN, FNP-BC Primary Care and Sports Medicine [facility] [place]

## 2024-06-15 ENCOUNTER — Other Ambulatory Visit (HOSPITAL_BASED_OUTPATIENT_CLINIC_OR_DEPARTMENT_OTHER): Payer: Self-pay

## 2024-06-15 ENCOUNTER — Ambulatory Visit: Admitting: Medical-Surgical

## 2024-06-15 DIAGNOSIS — G43909 Migraine, unspecified, not intractable, without status migrainosus: Secondary | ICD-10-CM

## 2024-06-15 NOTE — Progress Notes (Deleted)
        Established patient visit   History of Present Illness   Discussed the use of AI scribe software for clinical note transcription with the patient, who gave verbal consent to proceed.  History of Present Illness            Physical Exam   Physical Exam  Assessment & Plan   Assessment and Plan               Follow up   No follow-ups on file.  __________________________________ Zada FREDRIK Palin, DNP, APRN, FNP-BC Primary Care and Sports Medicine [facility] [place]

## 2024-06-17 ENCOUNTER — Other Ambulatory Visit (HOSPITAL_BASED_OUTPATIENT_CLINIC_OR_DEPARTMENT_OTHER): Payer: Self-pay

## 2024-06-17 ENCOUNTER — Other Ambulatory Visit: Payer: Self-pay | Admitting: Medical-Surgical

## 2024-06-17 DIAGNOSIS — G43909 Migraine, unspecified, not intractable, without status migrainosus: Secondary | ICD-10-CM

## 2024-06-17 MED ORDER — RIZATRIPTAN BENZOATE 5 MG PO TBDP
5.0000 mg | ORAL_TABLET | ORAL | 0 refills | Status: DC | PRN
  Filled 2024-06-17: qty 10, 5d supply, fill #0

## 2024-06-23 ENCOUNTER — Telehealth: Payer: Self-pay | Admitting: Medical-Surgical

## 2024-06-23 ENCOUNTER — Ambulatory Visit (INDEPENDENT_AMBULATORY_CARE_PROVIDER_SITE_OTHER): Admitting: Medical-Surgical

## 2024-06-23 ENCOUNTER — Ambulatory Visit (INDEPENDENT_AMBULATORY_CARE_PROVIDER_SITE_OTHER)

## 2024-06-23 ENCOUNTER — Encounter: Payer: Self-pay | Admitting: Medical-Surgical

## 2024-06-23 VITALS — BP 120/79 | HR 95 | Resp 20 | Ht 62.0 in | Wt 242.0 lb

## 2024-06-23 DIAGNOSIS — M25561 Pain in right knee: Secondary | ICD-10-CM

## 2024-06-23 DIAGNOSIS — F419 Anxiety disorder, unspecified: Secondary | ICD-10-CM | POA: Diagnosis not present

## 2024-06-23 DIAGNOSIS — M2550 Pain in unspecified joint: Secondary | ICD-10-CM | POA: Diagnosis not present

## 2024-06-23 DIAGNOSIS — J0100 Acute maxillary sinusitis, unspecified: Secondary | ICD-10-CM | POA: Diagnosis not present

## 2024-06-23 DIAGNOSIS — R0683 Snoring: Secondary | ICD-10-CM | POA: Diagnosis not present

## 2024-06-23 MED ORDER — METHYLPREDNISOLONE 4 MG PO TBPK: ORAL_TABLET | ORAL | 0 refills | Status: DC

## 2024-06-23 MED ORDER — AMOXICILLIN-POT CLAVULANATE 875-125 MG PO TABS: 1.0000 | ORAL_TABLET | Freq: Two times a day (BID) | ORAL | 0 refills | Status: DC

## 2024-06-23 MED ORDER — BUSPIRONE HCL 5 MG PO TABS: 5.0000 mg | ORAL_TABLET | Freq: Two times a day (BID) | ORAL | 1 refills | Status: DC

## 2024-06-23 MED ORDER — WEGOVY 0.25 MG/0.5ML ~~LOC~~ SOAJ: 0.2500 mg | SUBCUTANEOUS | 0 refills | Status: DC

## 2024-06-23 NOTE — Progress Notes (Signed)
 Established patient visit   History of Present Illness   Discussed the use of AI scribe software for clinical note transcription with the patient, who gave verbal consent to proceed.  History of Present Illness   Beth Holmes is a 23 year old female who presents with worsening sinus drainage and joint pain.  Upper respiratory symptoms - Worsening sinus drainage over the past 2-3 days, with chronic sinus issues intensifying - Pressure and pain behind the eyes, especially when looking at her phone - Greenish-yellow nasal drainage - Sore throat and mostly dry cough - Chills present - Over-the-counter medications (Mucinex , Sudafed, nasal sprays) have not provided relief - Epistaxis after scratching nose in sleep due to itchiness and deep pimple-like sensation under the skin - No recent travel or known exposure to sick individuals  Arthralgia and musculoskeletal pain - Worsening joint pain, particularly in hips and elbows - History of fall approximately 1.5 to 2 weeks ago, exacerbating knee pain - Knee pain is severe with extension or squatting and associated with a popping sensation - Ibuprofen and Bayer Back and Body have not been effective for pain relief  Psychological symptoms - Anxiety and depression managed with venlafaxine , but medication is less effective recently - Panic attacks, increased heart rate, and difficulty breathing - Symptoms exacerbated by recent life stressors and feelings of being overwhelmed and out of control  Loud snoring - Difficulty sleeping, with frequent nighttime awakenings and non-restorative sleep - Snoring and heavy breathing during sleep  Polydipsia and weight concerns - Excessive thirst present - Concern about weight - Family history of diabetes - Attempting to avoid sugary foods      Physical Exam   Physical Exam Vitals reviewed.  Constitutional:      General: She is not in acute distress.    Appearance: Normal appearance.  She is not ill-appearing.  HENT:     Head: Normocephalic and atraumatic.  Cardiovascular:     Rate and Rhythm: Normal rate and regular rhythm.     Pulses: Normal pulses.     Heart sounds: Normal heart sounds. No murmur heard.    No friction rub. No gallop.  Pulmonary:     Effort: Pulmonary effort is normal. No respiratory distress.     Breath sounds: Normal breath sounds. No wheezing.  Musculoskeletal:     Right knee: Swelling present. Tenderness present over the lateral joint line.     Comments: Palpable painful popping along the lateral knee with full flexion and extension  Skin:    General: Skin is warm and dry.  Neurological:     Mental Status: She is alert and oriented to person, place, and time.  Psychiatric:        Mood and Affect: Mood normal.        Behavior: Behavior normal.        Thought Content: Thought content normal.        Judgment: Judgment normal.    Assessment & Plan   Assessment and Plan    Acute on chronic sinusitis with upper respiratory symptoms Acute exacerbation of chronic sinusitis with worsening symptoms. Differential includes viral infection, allergies, or chronic sinusitis flare. Negative for flu and COVID. Exam consistent with maxillary sinusitis - Restart daily antihistamine such as Zyrtec or Xyzal. - Start Augmentin  BID x 7 days.  - Adding a Medrol  Dosepak.   Mechanical right knee pain, possible meniscal tear Right knee pain with mechanical symptoms suggestive of a meniscal tear  or ligamentous injury. - Order right knee x-ray. - Continue anti-inflammatories. - Order MRI if x-ray is inconclusive and symptoms persist.  Generalized joint pain Chronic joint pain with no definitive autoimmune diagnosis. Previous labs normal. Weight loss and stress management important. - Perform blood work to assess for rheumatoid factor and other autoimmune markers. - Discuss weight loss strategies and consider medications or injectables if insurance  approves.  Morbid obesity due to excess calories BMI of 44, morbidly obese. Discussed weight loss options. Insurance coverage uncertain, but high BMI and family history of diabetes may support approval. Sleep apnea evaluation may aid insurance approval. - Order home sleep study to assess for obstructive sleep apnea. - Checking A1c. - Submit insurance request for weight loss medication, appeal if necessary. - Discussed lifestyle modifications and dietary changes.  Loud snoring with non-restorative sleep Suspected obstructive sleep apnea based on symptoms and high BMI. STOPBANG score of 4/8. - Order home sleep study to confirm diagnosis.  Anxiety disorder and depression Anxiety and depression exacerbated by multiple stressors. Having more frequent panic attacks. Current venlafaxine  dose helps but needs it to work better. Discussed increasing dose or adding Buspar . - Prescribe Buspar  5-15 mg twice daily, adjust dose as needed. - Continue venlafaxine  150 mg, consider increasing to 225 mg if needed. - Discuss stress management techniques and coping strategies.     Follow up   Return in about 4 weeks (around 07/21/2024) for mood follow up. __________________________________ Zada FREDRIK Palin, DNP, APRN, FNP-BC Primary Care and Sports Medicine [facility]

## 2024-06-23 NOTE — Telephone Encounter (Signed)
 Patient refused triage, on schedule for today   Copied from CRM #[id number]. Topic: Clinical - Red Word Triage >> Jun 23, 2024  8:10 AM Carrielelia G wrote: [facility] that prompted transfer to Nurse Triage: sinus pressure, eye pressure, sinus drainage, joint pain , fail about a week and half ago. >> Jun 23, 2024  8:21 AM Marda G wrote: Patient stated she no longer wanted to hold for NT and just wanted an appt.  So I scheduled her for today. 06/23/24

## 2024-06-24 ENCOUNTER — Encounter: Payer: Self-pay | Admitting: Medical-Surgical

## 2024-06-24 LAB — LUPUS DIAGNOSTIC PROFILE

## 2024-06-24 LAB — POC COVID19 BINAXNOW: SARS Coronavirus 2 Ag: NEGATIVE

## 2024-06-24 LAB — POCT INFLUENZA A/B
Influenza A, POC: NEGATIVE
Influenza B, POC: NEGATIVE

## 2024-06-24 NOTE — Addendum Note (Signed)
 Addended by: FANNY NIELS CROME on: 06/24/2024 08:09 AM   Modules accepted: Orders

## 2024-06-25 ENCOUNTER — Telehealth: Payer: Self-pay

## 2024-06-25 ENCOUNTER — Other Ambulatory Visit (HOSPITAL_COMMUNITY): Payer: Self-pay

## 2024-06-25 NOTE — Telephone Encounter (Signed)
 Pharmacy Patient Advocate Encounter   Received notification from Physician's Office that prior authorization for Wegovy  0.25mg /0.4ml is required/requested.   Insurance verification completed.   The patient is insured through [facility] .   Per test claim: PA required; PA submitted to above mentioned insurance via Latent Key/confirmation #/EOC [id number] Status is pending

## 2024-06-25 NOTE — Telephone Encounter (Signed)
 Pharmacy Patient Advocate Encounter  Received notification from [employer] that Prior Authorization for Wegovy  0.25mg /0.32ml has been DENIED.  See denial reason below. No denial letter attached in CMM. Will attach denial letter to Media tab once received.   PA #/Case ID/Reference #: [id number]

## 2024-06-27 ENCOUNTER — Other Ambulatory Visit

## 2024-06-28 ENCOUNTER — Other Ambulatory Visit: Payer: Self-pay | Admitting: Medical-Surgical

## 2024-06-28 ENCOUNTER — Other Ambulatory Visit (HOSPITAL_BASED_OUTPATIENT_CLINIC_OR_DEPARTMENT_OTHER): Payer: Self-pay

## 2024-06-28 ENCOUNTER — Ambulatory Visit (HOSPITAL_BASED_OUTPATIENT_CLINIC_OR_DEPARTMENT_OTHER): Admitting: Student

## 2024-06-28 DIAGNOSIS — M25561 Pain in right knee: Secondary | ICD-10-CM

## 2024-06-29 ENCOUNTER — Ambulatory Visit: Payer: Self-pay | Admitting: Medical-Surgical

## 2024-07-02 ENCOUNTER — Other Ambulatory Visit (HOSPITAL_BASED_OUTPATIENT_CLINIC_OR_DEPARTMENT_OTHER): Payer: Self-pay

## 2024-07-04 ENCOUNTER — Other Ambulatory Visit

## 2024-07-05 ENCOUNTER — Ambulatory Visit (INDEPENDENT_AMBULATORY_CARE_PROVIDER_SITE_OTHER)

## 2024-07-05 DIAGNOSIS — M25561 Pain in right knee: Secondary | ICD-10-CM | POA: Diagnosis not present

## 2024-07-06 ENCOUNTER — Ambulatory Visit: Payer: Self-pay | Admitting: Medical-Surgical

## 2024-07-08 ENCOUNTER — Other Ambulatory Visit (HOSPITAL_BASED_OUTPATIENT_CLINIC_OR_DEPARTMENT_OTHER): Payer: Self-pay

## 2024-07-08 ENCOUNTER — Encounter: Payer: Self-pay | Admitting: Obstetrics and Gynecology

## 2024-07-08 ENCOUNTER — Other Ambulatory Visit: Payer: Self-pay

## 2024-07-08 ENCOUNTER — Ambulatory Visit (INDEPENDENT_AMBULATORY_CARE_PROVIDER_SITE_OTHER): Admitting: Obstetrics and Gynecology

## 2024-07-08 VITALS — BP 114/77 | HR 94 | Ht 62.7 in | Wt 241.0 lb

## 2024-07-08 DIAGNOSIS — R3989 Other symptoms and signs involving the genitourinary system: Secondary | ICD-10-CM | POA: Diagnosis not present

## 2024-07-08 DIAGNOSIS — N3281 Overactive bladder: Secondary | ICD-10-CM

## 2024-07-08 DIAGNOSIS — N941 Unspecified dyspareunia: Secondary | ICD-10-CM

## 2024-07-08 DIAGNOSIS — N393 Stress incontinence (female) (male): Secondary | ICD-10-CM | POA: Diagnosis not present

## 2024-07-08 DIAGNOSIS — R300 Dysuria: Secondary | ICD-10-CM

## 2024-07-08 DIAGNOSIS — M6289 Other specified disorders of muscle: Secondary | ICD-10-CM | POA: Diagnosis not present

## 2024-07-08 LAB — POCT URINALYSIS DIP (CLINITEK)
Bilirubin, UA: NEGATIVE
Glucose, UA: NEGATIVE mg/dL
Ketones, POC UA: NEGATIVE mg/dL
Leukocytes, UA: NEGATIVE
Nitrite, UA: NEGATIVE
POC PROTEIN,UA: NEGATIVE
Spec Grav, UA: >=1.03 — AB (ref 1.010–1.025)
Urobilinogen, UA: 0.2 U/dL
pH, UA: 5.5 (ref 5.0–8.0)

## 2024-07-08 MED ORDER — ESTRADIOL 0.1 MG/GM VA CREA
0.5000 g | TOPICAL_CREAM | VAGINAL | 11 refills | Status: AC
  Filled 2024-07-08: qty 42.5, 90d supply, fill #0

## 2024-07-08 MED ORDER — DIAZEPAM 5 MG PO TABS
ORAL_TABLET | ORAL | 0 refills | Status: AC
  Filled 2024-07-08: qty 30, 30d supply, fill #0

## 2024-07-08 MED ORDER — HEPARIN SODIUM (PORCINE) 10000 UNIT/ML IJ SOLN
10000.0000 [IU] | Freq: Once | INTRAMUSCULAR | Status: AC
  Administered 2024-07-08: 10000 [IU] via INTRAVESICAL

## 2024-07-08 MED ORDER — BUPIVACAINE HCL 0.25 % IJ SOLN
20.0000 mL | Freq: Once | INTRAMUSCULAR | Status: AC
  Administered 2024-07-08: 20 mL

## 2024-07-08 MED ORDER — SODIUM BICARBONATE 8.4 % IV SOLN
5.0000 mL | Freq: Once | INTRAVENOUS | Status: AC
  Administered 2024-07-08: 5 mL

## 2024-07-08 MED ORDER — LIDOCAINE HCL 2 % IJ SOLN
20.0000 mL | Freq: Once | INTRAMUSCULAR | Status: AC
  Administered 2024-07-08: 400 mg

## 2024-07-08 MED ORDER — MIRABEGRON ER 25 MG PO TB24
25.0000 mg | ORAL_TABLET | Freq: Every day | ORAL | 0 refills | Status: DC
  Filled 2024-07-08: qty 30, 30d supply, fill #0
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
## 2024-07-08 NOTE — Patient Instructions (Addendum)
 Today we talked about ways to manage bladder urgency such as altering your diet to avoid irritative beverages and foods (bladder diet) as well as attempting to decrease stress and other exacerbating factors. You can also chew a plain Tums 1-3 times per day to make your urine less acidic, especially if you have eating/drinking acidic things.   There is a website with helpful information for people with bladder irritation, called the IC Network at https://www.ic-network.com. This website has more information about a healthy bladder diet and patient forums for support.  The Most Bothersome Foods* The Least Bothersome Foods*  Coffee - Regular & Decaf Tea - caffeinated Carbonated beverages - cola, non-colas, diet & caffeine-free Alcohols - Beer, Red Wine, White Wine, [address] - Grapefruit, [place], Orange, [employer] - Cranberry, Grapefruit, Orange, Pineapple Vegetables - Tomato & Tomato Products Flavor Enhancers - Hot peppers, Spicy foods, Chili, Horseradish, Vinegar, Monosodium glutamate (MSG) Artificial Sweeteners - NutraSweet, Sweet 'N Low, Equal (sweetener), Saccharin Ethnic foods - [place], [place], [place] food [employer] - low-fat & whole Fruits - Bananas, Blueberries, Honeydew melon, Pears, Raisins, Watermelon Vegetables - Broccoli, [address] Sprouts, [place], Carrots, Cauliflower, [place], Cucumber, Mushrooms, Peas, Radishes, Squash, Zucchini, White potatoes, Sweet potatoes & yams Poultry - Chicken, Eggs, [place], [employer] - Beef, Diplomatic Services operational officer, Lamb Seafood - Shrimp, [place] fish, Salmon Grains - Oat, Rice Snacks - Pretzels, Popcorn  *Mitch ALF et al. Diet and its role in interstitial cystitis/bladder pain syndrome (IC/BPS) and comorbid conditions. [employer]U [employer]. BJU Int. 2012 Jan 11.     Use vaginal valium  every night for the next two weeks. After the initial two weeks, use this as needed for muscle spasms and cramping  Re-start vaginal estrogen cream. Use this nightly for two  weeks and then twice a week after.   We will get you scheduled for a cysto  We will get scheduled for urodynamics  We will work on getting approval for Myrbetriq  25mg    We will discuss more about the stress incontinence after urodynamics

## 2024-07-15 ENCOUNTER — Other Ambulatory Visit: Payer: Self-pay | Admitting: Medical-Surgical

## 2024-07-19 NOTE — Telephone Encounter (Signed)
 Last filled 06/23/2024 for 30 days supply  Pharmacy is requesting 90 days supply  Last OV 06/23/2024  Upcoming appointment 07/22/2024

## 2024-07-22 ENCOUNTER — Ambulatory Visit: Admitting: Medical-Surgical

## 2024-07-22 LAB — LUPUS PROGNOSTIC PROFILE
ANTI-RIBOSOMAL P AB (RDL): <20 U (ref <20)
Anti-Beta2 Glycoprotein I IgA: <26 SAU (ref <26)
Anti-Beta2 Glycoprotein I IgG: <21 SGU (ref <21)
Anti-Beta2 Glycoprotein I IgM: <33 SMU (ref <33)
Anti-C1Q Ab, IgG (RDL): <20 U (ref <20)
Anti-Cardiolipin Ab, IgA (RDL): <12 U/mL (ref <12)
Anti-Cardiolipin Ab, IgG (RDL): <15 GPL U/mL (ref <15)
Anti-Cardiolipin Ab, IgM (RDL): <13 [MPL'U]/mL (ref <13)
Anti-PS/PT Ab IgG: <31 U (ref <31)
Anti-PS/PT Ab IgM: <31 U (ref <31)

## 2024-07-22 LAB — HEMOGLOBIN A1C
Est. average glucose Bld gHb Est-mCnc: 111 mg/dL
Hgb A1c MFr Bld: 5.5 % (ref 4.8–5.6)

## 2024-07-22 LAB — CYCLIC CITRUL PEPTIDE ANTIBODY, IGG/IGA: Cyclic Citrullin Peptide Ab: <1 U (ref 0–19)

## 2024-07-22 LAB — RHEUMATOID FACTOR: Rheumatoid fact SerPl-aCnc: <10 [IU]/mL (ref <14.0)

## 2024-07-22 NOTE — Telephone Encounter (Signed)
 Bladder instillation scheduled for 10/10.  KD CMA

## 2024-07-22 NOTE — Progress Notes (Deleted)
        Established patient visit   History of Present Illness   Discussed the use of AI scribe software for clinical note transcription with the patient, who gave verbal consent to proceed.  History of Present Illness           Physical Exam   Physical Exam Assessment & Plan   Problem List Items Addressed This Visit       Other   Anxiety - Primary   Assessment and Plan             Follow up   No follow-ups on file. __________________________________ Zada FREDRIK Palin, DNP, APRN, FNP-BC Primary Care and Sports Medicine [facility] [place]

## 2024-07-23 ENCOUNTER — Ambulatory Visit

## 2024-07-27 ENCOUNTER — Encounter: Payer: Self-pay | Admitting: Medical-Surgical

## 2024-07-27 LAB — PATHNOSTICS MOLECULAR TEST: Negative: NEGATIVE

## 2024-07-29 ENCOUNTER — Ambulatory Visit

## 2024-08-09 ENCOUNTER — Ambulatory Visit: Admitting: Medical-Surgical

## 2024-08-09 NOTE — Progress Notes (Deleted)
        Established patient visit   History of Present Illness   Discussed the use of AI scribe software for clinical note transcription with the patient, who gave verbal consent to proceed.  History of Present Illness           Physical Exam   Physical Exam Assessment & Plan   Problem List Items Addressed This Visit       Other   Anxiety - Primary   Assessment and Plan             Follow up   No follow-ups on file. __________________________________ Zada FREDRIK Palin, DNP, APRN, FNP-BC Primary Care and Sports Medicine [facility] [place]

## 2024-08-12 ENCOUNTER — Telehealth (HOSPITAL_BASED_OUTPATIENT_CLINIC_OR_DEPARTMENT_OTHER): Payer: Self-pay

## 2024-08-12 ENCOUNTER — Other Ambulatory Visit (HOSPITAL_BASED_OUTPATIENT_CLINIC_OR_DEPARTMENT_OTHER): Payer: Self-pay

## 2024-08-12 ENCOUNTER — Encounter: Payer: Self-pay | Admitting: Obstetrics and Gynecology

## 2024-08-12 ENCOUNTER — Ambulatory Visit: Payer: Self-pay | Admitting: Obstetrics and Gynecology

## 2024-08-12 ENCOUNTER — Ambulatory Visit: Admitting: Obstetrics and Gynecology

## 2024-08-12 VITALS — BP 126/79 | HR 99

## 2024-08-12 DIAGNOSIS — N3281 Overactive bladder: Secondary | ICD-10-CM

## 2024-08-12 DIAGNOSIS — M62838 Other muscle spasm: Secondary | ICD-10-CM | POA: Diagnosis not present

## 2024-08-12 DIAGNOSIS — R350 Frequency of micturition: Secondary | ICD-10-CM

## 2024-08-12 DIAGNOSIS — N809 Endometriosis, unspecified: Secondary | ICD-10-CM

## 2024-08-12 DIAGNOSIS — R3989 Other symptoms and signs involving the genitourinary system: Secondary | ICD-10-CM

## 2024-08-12 LAB — POCT URINALYSIS DIP (CLINITEK)
Bilirubin, UA: NEGATIVE
Glucose, UA: NEGATIVE mg/dL
Ketones, POC UA: NEGATIVE mg/dL
Leukocytes, UA: NEGATIVE
Nitrite, UA: NEGATIVE
POC PROTEIN,UA: 30 — AB
Spec Grav, UA: 1.025 (ref 1.010–1.025)
Urobilinogen, UA: 0.2 U/dL
pH, UA: 5.5 (ref 5.0–8.0)

## 2024-08-12 MED ORDER — LIDOCAINE HCL URETHRAL/MUCOSAL 2 % EX GEL
1.0000 | Freq: Once | CUTANEOUS | Status: AC
  Administered 2024-08-12: 1 via URETHRAL

## 2024-08-12 MED ORDER — LIDOCAINE HCL URETHRAL/MUCOSAL 2 % EX GEL: 1.0000 | Freq: Once | CUTANEOUS | Status: DC

## 2024-08-12 MED ORDER — MIRABEGRON ER 25 MG PO TB24
25.0000 mg | ORAL_TABLET | Freq: Every day | ORAL | 5 refills | Status: DC
  Filled 2024-08-12 – 2024-09-10 (×2): qty 30, 30d supply, fill #0

## 2024-08-12 NOTE — Addendum Note (Signed)
 Addended by: MARILYNNE ROSALINE SAILOR on: 08/12/2024 10:17 AM   Modules accepted: Orders

## 2024-08-12 NOTE — Assessment & Plan Note (Signed)
-   Temporary improvement with vaginal valium . No improvement with trigger point injections previously - Recommended returning to pelvic PT, referral placed

## 2024-08-12 NOTE — Assessment & Plan Note (Signed)
-   Will refer to Dr Jeralyn, [facility] for further management

## 2024-08-12 NOTE — Telephone Encounter (Signed)
 Notfied the patient that her Mirabegron  ER 25 MG er tablets were approved and she can pick them up at her pharmacy.

## 2024-08-12 NOTE — Progress Notes (Signed)
 [facility] Return Visit  SUBJECTIVE  History of Present Illness: Beth Holmes is a 23 y.o. female seen in follow-up for bladder pain, levator spasm and   Previous patient of [facility] for endometriosis. She was on mirena IUD and norethindrone . She had been seen by a gynecologist in [place] and was switched to oral contraceptive. She feels she has not seen any difference in 5 months of treatment. That office no longer takes her insurance and she is looking for another physician that can manage her endometriosis.   She did not feel the bladder instillation given at last visit helped much, in fact she had 3 days of pain after. She has tried vaginal valium  prescribed last visit but it only helps a short time. She did not receive the myrbetriq  for her OAB symptoms- states it was not given to her at the pharmacy. Has done trigger point injection at the [facility] office without relief. Has done pelvic PT in the past but it has been some time.   Past Medical History: Patient  has a past medical history of Anxiety, Asthma, Chronic pelvic pain in female, Depression, and Ovarian cyst.   Past Surgical History: She  has a past surgical history that includes Tonsillectomy; Ovarian cyst surgery; Adenoidectomy; and Upper gastrointestinal endoscopy (08/11/2020).   Medications: She has a current medication list which includes the following prescription(s): albuterol , buspirone , diazepam , estradiol , ibuprofen, norethindrone -ethinyl estradiol -fe, ondansetron , rizatriptan , valacyclovir , venlafaxine  xr, and mirabegron  er.   Allergies: Patient has no known allergies.   Social History: Patient  reports that she has never smoked. She has never used smokeless tobacco. She reports current alcohol use. She reports that she does not use drugs.     OBJECTIVE     Physical Exam: Vitals:   08/12/24 0827  BP: (!) 148/107  Pulse: 82   Gen: No apparent distress, A&O x 3.  CYSTOSCOPY: A time out was  performed.  The periurethral area was prepped and draped in a sterile manner.  2% lidocaine  jetpack was inserted at the urethral meatus. The urethra and bladder were visualized with flexible cystoscope.  She had normal urethral coaptation and normal urethral mucosa.  She had normal bladder mucosa. She had bilateral clear efflux from both ureteral orifices.  She had no squamous metaplasia at the trigone, no trabeculations, cellules or diverticuli.     Cystoscopy today is normal.    Results for orders placed or performed in visit on 08/12/24  POCT URINALYSIS DIP (CLINITEK)   Collection Time: 08/12/24  8:44 AM  Result Value Ref Range   Color, UA yellow yellow   Clarity, UA clear clear   Glucose, UA negative negative mg/dL   Bilirubin, UA negative negative   Ketones, POC UA negative negative mg/dL   Spec Grav, UA 8.974 8.989 - 1.025   Blood, UA large (A) negative   pH, UA 5.5 5.0 - 8.0   POC PROTEIN,UA =30 (A) negative, trace   Urobilinogen, UA 0.2 0.2 or 1.0 E.U./dL   Nitrite, UA Negative Negative   Leukocytes, UA Negative Negative   ASSESSMENT AND PLAN    Beth Holmes is a 24 y.o. with:  1. Bladder pain   2. OAB (overactive bladder)   3. Levator spasm   4. Endometriosis   5. Urinary frequency     Bladder pain Assessment & Plan: - We discussed this can coincide with endometriosis, especially if there is pelvic floor muscle spasm involved.  - Cystoscopy today normal - Takes azo prn  for symptoms. Symptoms not improved with bladder instillations.   Orders: -     Mirabegron  ER; Take 1 tablet (25 mg total) by mouth daily.  Dispense: 30 tablet; Refill: 5 -     AMB referral to rehabilitation  OAB (overactive bladder) Assessment & Plan: - Reordered myrbetriq . May need to obtain authorization since the pharmacy did not fill previously.  - previously tried ditropan   Orders: -     Mirabegron  ER; Take 1 tablet (25 mg total) by mouth daily.  Dispense: 30 tablet; Refill: 5 -     AMB  referral to rehabilitation  Levator spasm Assessment & Plan: - Temporary improvement with vaginal valium . No improvement with trigger point injections previously - Recommended returning to pelvic PT, referral placed  Orders: -     AMB referral to rehabilitation  Endometriosis Assessment & Plan: - Will refer to Dr Jeralyn, [facility] for further management  Orders: -     Ambulatory referral to Obstetrics / Gynecology  Urinary frequency -     POCT URINALYSIS DIP (CLINITEK)     Rosaline LOISE Caper, MD

## 2024-08-12 NOTE — Patient Instructions (Signed)
Taking Care of Yourself after Urodynamics, Cystoscopy, Bulkamid Injection, or Botox Injection   Drink plenty of water for a day or two following your procedure. Try to have about 8 ounces (one cup) at a time, and do this 6 times or more per day unless you have fluid restrictitons AVOID irritative beverages such as coffee, tea, soda, alcoholic or citrus drinks for a day or two, as this may cause burning with urination.  For the first 1-2 days after the procedure, your urine may be pink or red in color. You may have some blood in your urine as a normal side effect of the procedure. Large amounts of bleeding or difficulty urinating are NOT normal. Call the nurse line if this happens or go to the nearest Emergency Room if the bleeding is heavy or you cannot urinate at all and it is after hours.  You may experience some discomfort or a burning sensation with urination after having this procedure. You can use over the counter Azo or pyridium to help with burning and follow the instructions on the packaging. If it does not improve within 1-2 days, or other symptoms appear (fever, chills, or difficulty urinating) call the office to speak to a nurse.  You may return to normal daily activities such as work, school, driving, exercising and housework on the day of the procedure.

## 2024-08-12 NOTE — Assessment & Plan Note (Signed)
-   We discussed this can coincide with endometriosis, especially if there is pelvic floor muscle spasm involved.  - Cystoscopy today normal - Takes azo prn for symptoms. Symptoms not improved with bladder instillations.

## 2024-08-12 NOTE — Addendum Note (Signed)
 Addended by: Ashaunte Standley N on: 08/12/2024 12:39 PM   Modules accepted: Orders

## 2024-08-12 NOTE — Assessment & Plan Note (Signed)
-   Reordered myrbetriq . May need to obtain authorization since the pharmacy did not fill previously.  - previously tried ditropan

## 2024-08-16 ENCOUNTER — Encounter: Payer: Self-pay | Admitting: Radiology

## 2024-08-18 ENCOUNTER — Encounter: Admitting: Obstetrics and Gynecology

## 2024-08-23 ENCOUNTER — Other Ambulatory Visit (HOSPITAL_BASED_OUTPATIENT_CLINIC_OR_DEPARTMENT_OTHER): Payer: Self-pay

## 2024-08-24 ENCOUNTER — Ambulatory Visit: Admitting: Obstetrics and Gynecology

## 2024-08-30 ENCOUNTER — Ambulatory Visit
Admission: EM | Admit: 2024-08-30 | Discharge: 2024-08-30 | Disposition: A | Discharge status: Home / Self Care | Attending: Family Medicine | Admitting: Family Medicine

## 2024-08-30 ENCOUNTER — Other Ambulatory Visit (HOSPITAL_BASED_OUTPATIENT_CLINIC_OR_DEPARTMENT_OTHER): Payer: Self-pay

## 2024-08-30 ENCOUNTER — Telehealth: Payer: Self-pay

## 2024-08-30 ENCOUNTER — Encounter: Payer: Self-pay | Admitting: Family Medicine

## 2024-08-30 ENCOUNTER — Ambulatory Visit: Admitting: Family Medicine

## 2024-08-30 ENCOUNTER — Ambulatory Visit: Payer: Self-pay

## 2024-08-30 ENCOUNTER — Other Ambulatory Visit: Payer: Self-pay

## 2024-08-30 VITALS — BP 124/72 | HR 80 | Temp 98.1°F | Ht 62.0 in

## 2024-08-30 DIAGNOSIS — M545 Low back pain, unspecified: Secondary | ICD-10-CM | POA: Diagnosis not present

## 2024-08-30 DIAGNOSIS — R0609 Other forms of dyspnea: Secondary | ICD-10-CM | POA: Insufficient documentation

## 2024-08-30 DIAGNOSIS — S39012A Strain of muscle, fascia and tendon of lower back, initial encounter: Secondary | ICD-10-CM

## 2024-08-30 DIAGNOSIS — J0100 Acute maxillary sinusitis, unspecified: Secondary | ICD-10-CM | POA: Insufficient documentation

## 2024-08-30 LAB — POCT URINALYSIS DIP (MANUAL ENTRY)
Bilirubin, UA: NEGATIVE
Glucose, UA: NEGATIVE mg/dL
Ketones, POC UA: NEGATIVE mg/dL
Leukocytes, UA: NEGATIVE
Nitrite, UA: NEGATIVE
Protein Ur, POC: NEGATIVE mg/dL
Spec Grav, UA: >=1.03 — AB (ref 1.010–1.025)
Urobilinogen, UA: 0.2 U/dL
pH, UA: 5.5 (ref 5.0–8.0)

## 2024-08-30 MED ORDER — METHYLPREDNISOLONE 4 MG PO TBPK
ORAL_TABLET | ORAL | 0 refills | Status: AC
  Filled 2024-08-30: qty 21, 6d supply, fill #0

## 2024-08-30 MED ORDER — NAPROXEN SODIUM 550 MG PO TABS
550.0000 mg | ORAL_TABLET | Freq: Two times a day (BID) | ORAL | 0 refills | Status: DC
  Filled 2024-08-30: qty 30, 15d supply, fill #0

## 2024-08-30 MED ORDER — TIZANIDINE HCL 4 MG PO TABS
4.0000 mg | ORAL_TABLET | Freq: Four times a day (QID) | ORAL | 0 refills | Status: DC | PRN
  Filled 2024-08-30: qty 21, 3d supply, fill #0

## 2024-08-30 MED ORDER — KETOROLAC TROMETHAMINE 30 MG/ML IJ SOLN
30.0000 mg | Freq: Once | INTRAMUSCULAR | Status: AC
  Administered 2024-08-30: 30 mg via INTRAMUSCULAR

## 2024-08-30 MED ORDER — AMOXICILLIN-POT CLAVULANATE 875-125 MG PO TABS
1.0000 | ORAL_TABLET | Freq: Two times a day (BID) | ORAL | 0 refills | Status: DC
  Filled 2024-08-30: qty 20, 10d supply, fill #0

## 2024-08-30 NOTE — Assessment & Plan Note (Addendum)
 Maxillary tenderness upon exam. Hx of nasal congestion for over one week. Augmentin  875-125 mg BID for 10 days. Sinus rinses. Follow-up if symptoms do not improve.

## 2024-08-30 NOTE — Telephone Encounter (Signed)
 Went to Urgent Care and dx with Upper Respiratory Infection--- Patient states severe back pain Went to Urgent Care today Advised this could be lumbar Patient states shortness of breath also worse with exertion No appointments available in PCP office so patient was scheduled to see Darice Brownie FNP at Primary Care at [facility] today    FYI Only or Action Required?: FYI only for provider: appointment scheduled on 08/30/2024 at 4:10pm with Darice Brownie FNP at Primary Care at [facility].  Patient was last seen in primary care on 06/23/2024 by Willo Mini, NP.  Called Nurse Triage reporting Shortness of Breath.  Symptoms began almost 2 weeks ago al together but back pain and shortness of breath within the past few days has gotten worse.  Interventions attempted: OTC medications: ibuprofen, allergy medicine, Prescription medications: inhaler, and Rest, hydration, or home remedies.  Symptoms are: gradually worsening.  Triage Disposition: See HCP Within 4 Hours (Or PCP Triage)  Patient/caregiver understands and will follow disposition?: Yes                Copied from CRM #[id number]. Topic: Clinical - Red Word Triage >> Aug 30, 2024 12:17 PM Zebedee SAUNDERS wrote: Red Word that prompted transfer to Nurse Triage: Pt has shortness of breath, congestion, and mid to lower back pain. Reason for Disposition  [1] MILD difficulty breathing (e.g., minimal/no SOB at rest, SOB with walking, pulse < 100) AND [2] NEW-onset or WORSE than normal  Answer Assessment - Initial Assessment Questions Patient has had asthma and lumbar issues in the past too Patient also states she feels shaky for the past week Unknown about fever but she states she has had some chills  Patient is advised to call us  back if anything changes or with any further questions/concerns. Patient is advised that if anything worsens to go to the Emergency Room. Patient verbalized understanding.     1. RESPIRATORY  STATUS: Describe your breathing? (e.g., wheezing, shortness of breath, unable to speak, severe coughing)      Shortness of breath 2. ONSET: When did this breathing problem begin?      3 days  3. PATTERN Does the difficult breathing come and go, or has it been constant since it started?      Constant but worse with exertion 4. SEVERITY: How bad is your breathing? (e.g., mild, moderate, severe)      Short winded 7. LUNG HISTORY: Do you have any history of lung disease?  (e.g., pulmonary embolus, asthma, emphysema)     asthma 8. CAUSE: What do you think is causing the breathing problem?      unsure 9. OTHER SYMPTOMS: Do you have any other symptoms? (e.g., chest pain, cough, dizziness, fever, runny nose)     Shortness of breath, lower back pain productive cough 10. O2 SATURATION MONITOR:  Do you use an oxygen saturation monitor (pulse oximeter) at home? If Yes, ask: What is your reading (oxygen level) today? What is your usual oxygen saturation reading? (e.g., 95%)       unknown 11. PREGNANCY: Is there any chance you are pregnant? When was your last menstrual period?       No 12. TRAVEL: Have you traveled out of the country in the last month? (e.g., travel history, exposures)       No  Protocols used: Breathing Difficulty-A-AH

## 2024-08-30 NOTE — ED Triage Notes (Signed)
 Pt c/o back pain x 1 week. Pt stated that she went to Urgent last Monday for an Respiratory infection and  tested neg for Covid. Pt stated that she took 200 mg Gabapentin , heating pad and Tylenol  last night with minimal effectiveness.

## 2024-08-30 NOTE — Telephone Encounter (Signed)
 Pt already triaged and seen in office today.   Copied from CRM #[id number]. Topic: Clinical - Red Word Triage >> Aug 30, 2024 12:05 PM Aleatha C wrote: Red Word that prompted transfer to Nurse Triage: Severe back pain and body  shaking

## 2024-08-30 NOTE — Discharge Instructions (Signed)
 Take Anaprox 2 times a day with food This is an anti-inflammatory pain medication Take tizanidine as needed as muscle relaxer.  This may cause drowsiness.  It is helpful at bedtime Limit bending and lifting while back is painful See your doctor if not improving in a few days

## 2024-08-30 NOTE — Assessment & Plan Note (Addendum)
 No obvious shortness of breath. Described symptoms as chest heaviness.  Lung sounds were diminished.  Hx of asthma, will treat with antibiotic and steroid.  No wheezing. Medrol  dose pack for 6 days.  Follow-up as needed.

## 2024-08-30 NOTE — ED Provider Notes (Signed)
 [facility]    CSN: [id number] Arrival date & time: 08/30/24  1051      History   Chief Complaint Chief Complaint  Patient presents with   Back Pain    HPI Beth Holmes is a 23 y.o. female.   HPI  Patient works for a [employer] and does a lot of heavy lifting.  She does not recall any lifting incident at her work.  She is here for gradually worsening low back pain for about a week.  It hurts in her middle back.  Hurts worse with movement.  No nausea or vomiting.  No fever or chills.  States she always has urinary symptoms.  No dysuria or frequency.  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 Authorizing Provider  naproxen sodium (ANAPROX DS) 550 MG tablet Take 1 tablet (550 mg total) by mouth 2 (two) times daily with a meal. 08/30/24  Yes Maranda Jamee Jacob, MD  tiZANidine (ZANAFLEX) 4 MG tablet Take 1-2  tablets (4-8 mg total) by mouth every 6 (six) hours as needed for muscle spasms. 08/30/24  Yes Maranda Jamee Jacob, MD  albuterol  (VENTOLIN  HFA) 108 [phone] Base) MCG/ACT inhaler Inhale 2 puffs into the lungs every 6 (six) hours as needed for wheezing. 08/27/21   Willo Mini, NP  busPIRone  (BUSPAR ) 5 MG tablet Take 1-3 tablets (5-15 mg total) by mouth 2 (two) times daily. 06/23/24   Willo Mini, NP  diazepam  (VALIUM ) 5 MG tablet Place 1 tablet vaginally nightly as needed for muscle spasm/ pelvic pain. 07/08/24   Zuleta, Kaitlin G, NP  estradiol  (ESTRACE ) 0.1 MG/GM vaginal cream Place 0.5 g vaginally nightly for two weeks then 0.5 grams vaginally twice weekly thereafter. 07/08/24   Zuleta, Kaitlin G, NP  ibuprofen (ADVIL,MOTRIN) 200 MG tablet Take 600 mg by mouth every 6 (six) hours as needed for mild pain.    [provider]  mirabegron  ER (MYRBETRIQ ) 25 MG TB24 tablet Take 1 tablet (25 mg total) by mouth daily. 08/12/24   Marilynne Rosaline SAILOR, MD  norethindrone -ethinyl estradiol -FE (  JUNEL  FE 1/20) 1-20 MG-MCG tablet Take 1 tablet by mouth daily. 04/06/24     ondansetron  (ZOFRAN -ODT) 8 MG disintegrating tablet Take 1 tablet (8 mg total) by mouth every 8 (eight) hours as needed for nausea. 11/27/22   Breeback, Jade L, PA-C  rizatriptan  (MAXALT -MLT) 5 MG disintegrating tablet Take 1 tablet (5 mg total) by mouth as needed for migraine. NEEDS APPOINTMENT FOR FURTHER REFILLS. May repeat in 2 hours if needed 06/17/24   Willo Mini, NP  valACYclovir  (VALTREX ) 500 MG tablet Take 1 tablet (500 mg total) by mouth 2 (two) times daily as needed. 01/08/24   Willo Mini, NP  venlafaxine  XR (EFFEXOR  XR) 150 MG 24 hr capsule Take 1 capsule (150 mg total) by mouth daily with breakfast. 01/08/24   Willo Mini, NP    Family History Family History  Problem Relation Age of Onset   Thyroid  disease Father    Lung cancer Father    Throat cancer Father    Bone cancer Father    Diabetes Maternal Grandmother    Hypertension  Maternal Grandmother    Diabetes Maternal Grandfather    Hypertension Maternal Grandfather    Hypertension Paternal Grandmother    Colon cancer Paternal Grandmother    Hypertension Paternal Grandfather    Ovarian cancer Paternal Aunt    Crohn's disease Neg Hx    Inflammatory bowel disease Neg Hx    Food intolerance Neg Hx    Celiac disease Neg Hx    GER disease Neg Hx    Migraines Neg Hx    Esophageal cancer Neg Hx    Stomach cancer Neg Hx    Rectal cancer Neg Hx     Social History Social History   Tobacco Use   Smoking status: Never   Smokeless tobacco: Never  Vaping Use   Vaping status: Never Used  Substance Use Topics   Alcohol use: Yes    Comment: Ocassional   Drug use: Never     Allergies   Patient has no known allergies.   Review of Systems Review of Systems See HPI  Physical Exam Triage Vital Signs ED Triage Vitals  Encounter Vitals Group     BP 08/30/24 1119 123/84     Girls Systolic BP Percentile --      Girls Diastolic BP Percentile --      Boys Systolic BP Percentile --      Boys Diastolic BP Percentile --      Pulse Rate 08/30/24 1119 82     Resp 08/30/24 1119 18     Temp 08/30/24 1119 98.1 F (36.7 C)     Temp Source 08/30/24 1119 Oral     SpO2 08/30/24 1119 97 %     Weight 08/30/24 1122 235 lb (106.6 kg)     Height 08/30/24 1122 5' 2 (1.575 m)     Head Circumference --      Peak Flow --      Pain Score 08/30/24 1121 9     Pain Loc --      Pain Education --      Exclude from Growth Chart --    No data found.  Updated Vital Signs BP 123/84 (BP Location: Right Arm)   Pulse 82   Temp 98.1 F (36.7 C) (Oral)   Resp 18   Ht 5' 2 (1.575 m)   Wt 106.6 kg   LMP 08/26/2024   SpO2 97%   BMI 42.98 kg/m  :  Physical Exam Constitutional:      General: She is not in acute distress.    Appearance: She is well-developed. She is obese.  HENT:     Head: Normocephalic and atraumatic.  Eyes:     Conjunctiva/sclera: Conjunctivae  normal.     Pupils: Pupils are equal, round, and reactive to light.  Cardiovascular:     Rate and Rhythm: Normal rate.  Pulmonary:     Effort: Pulmonary effort is normal. No respiratory distress.  Abdominal:     General: There is no distension.     Palpations: Abdomen is soft.  Musculoskeletal:        General: Tenderness present. Normal range of motion.     Cervical back: Normal range of motion.     Comments: There is tenderness centrally at the L5-S1 junction.  Mild tenderness in both lumbar column of muscles.  Strength sensation, range of motion and reflexes, normal in lower extremities.  Straight leg raise is negative bilaterally.  Skin:    General: Skin is warm and dry.  Neurological:     Mental Status: She is alert.      UC Treatments / Results  Labs (all labs ordered are listed, but only abnormal results are displayed) Labs Reviewed  POCT URINALYSIS DIP (MANUAL ENTRY) - Abnormal; Notable for the following components:      Result Value   Clarity, UA cloudy (*)    Spec Grav, UA >=1.030 (*)    Blood, UA large (*)    All other components within normal limits    EKG   Radiology No results found.  Procedures Procedures (including critical care time)  Medications Ordered in UC Medications  ketorolac  (TORADOL ) 30 MG/ML injection 30 mg (30 mg Intramuscular Given 08/30/24 1214)    Initial Impression / Assessment and Plan / UC Course  I have reviewed the triage vital signs and the nursing notes.  Pertinent labs & imaging results that were available during my care of the patient were reviewed by me and considered in my medical decision making (see chart for details).     Explained to patient that she appears to have a muscular back strain. Patient is given an injection of Toradol  for her acute pain. Final Clinical Impressions(s) / UC Diagnoses   Final diagnoses:  Acute midline low back pain without sciatica  Strain of lumbar region, initial encounter      Discharge Instructions      Take Anaprox 2 times a day with food This is an anti-inflammatory pain medication Take tizanidine as needed as muscle relaxer.  This may cause drowsiness.  It is helpful at bedtime Limit bending and lifting while back is painful See your doctor if not improving in a few days   ED Prescriptions     Medication Sig Dispense Auth. Provider   naproxen sodium (ANAPROX DS) 550 MG tablet Take 1 tablet (550 mg total) by mouth 2 (two) times daily with a meal. 30 tablet Maranda Jamee Jacob, MD   tiZANidine (ZANAFLEX) 4 MG tablet Take 1-2 tablets (4-8 mg total) by mouth every 6 (six) hours as needed for muscle spasms. 21 tablet Maranda Jamee Jacob, MD      PDMP not reviewed this encounter.   Maranda Jamee Jacob, MD 08/30/24 [phone]

## 2024-08-30 NOTE — Progress Notes (Signed)
 Acute Office Visit  Subjective:     Patient ID: Beth Holmes, female    DOB: Jan 19, 2001, 23 y.o.   MRN: [id number]  Chief Complaint  Patient presents with   Shortness of Breath    UC - last Monday upper respiratory infection that they didn't know which one  Has gotten worse the past 3 days   Back Pain    Slightly above lower back all across radiating to shoulders UC - gave pain medication and muscle relaxer - today    HPI Patient is in today for URI. Went to urgent care last week, negative covid. Back pain radiates to shoulder blades. Urgent care prescribed muscle relaxer and Toradol .  Continues to have congetion and cough. Shortness of breath/heavness when walking. Hx of asthma, no wheezing.  No obvious shortness of breath. Lung sounds are diminished bilaterally.  Talking in complete sentences.  Vital signs are stable. Oxygen saturation 97%.  Using albuterol  inhaler, not helping much.  Pain in back with deep inspiration.  Urgent care prescribed naproxen for upper back pain.  Got Toradol  that has helped some.  Instructed to wait 24 hours  before starting naproxen. Will defer chest x-ray today, will treat sinus infection with Augmentin . Augmentin  875-125 mg BID for 10 days. Medrol  dose pack for 6 days. If symptoms do not improve in next 24-48 hours, she will follow-up and will send for chest x-ray at that time.      Review of Systems  Constitutional:  Positive for chills and malaise/fatigue. Negative for fever.  HENT:  Positive for congestion and sinus pain. Negative for ear pain.   Respiratory:  Positive for cough, sputum production and shortness of breath. Negative for wheezing.         Objective:    BP 124/72 (BP Location: Left Arm, Patient Position: Sitting, Cuff Size: Normal)   Pulse 80   Temp 98.1 F (36.7 C) (Oral)   Ht 5' 2 (1.575 m)   LMP 08/26/2024   SpO2 97%   BMI 42.98 kg/m    Physical Exam Vitals and nursing note reviewed.   Constitutional:      General: She is not in acute distress.    Appearance: She is well-developed. She is ill-appearing. She is not toxic-appearing.  HENT:     Right Ear: Tympanic membrane normal.     Left Ear: Tympanic membrane normal.     Nose:     Right Sinus: Maxillary sinus tenderness present.     Left Sinus: Maxillary sinus tenderness present.     Mouth/Throat:     Pharynx: Uvula midline. Posterior oropharyngeal erythema present. No oropharyngeal exudate.  Pulmonary:     Effort: No tachypnea, accessory muscle usage or respiratory distress.     Breath sounds: Examination of the right-lower field reveals decreased breath sounds. Examination of the left-lower field reveals decreased breath sounds. Decreased breath sounds present.  Neurological:     Mental Status: She is alert.     Results for orders placed or performed during the hospital encounter of 08/30/24  POCT urinalysis dipstick  Result Value Ref Range   Color, UA yellow yellow   Clarity, UA cloudy (A) clear   Glucose, UA negative negative mg/dL   Bilirubin, UA negative negative   Ketones, POC UA negative negative mg/dL   Spec Grav, UA >=8.969 (A) 1.010 - 1.025   Blood, UA large (A) negative   pH, UA 5.5 5.0 - 8.0   Protein Ur, POC negative negative mg/dL  Urobilinogen, UA 0.2 0.2 or 1.0 E.U./dL   Nitrite, UA Negative Negative   Leukocytes, UA Negative Negative        Assessment & Plan:   Problem List Items Addressed This Visit     Acute non-recurrent maxillary sinusitis - Primary   Maxillary tenderness upon exam. Hx of nasal congestion for over one week. Augmentin  875-125 mg BID for 10 days. Sinus rinses. Follow-up if symptoms do not improve.       Relevant Medications   amoxicillin -clavulanate (AUGMENTIN ) 875-125 MG tablet   methylPREDNISolone  (MEDROL  DOSEPAK) 4 MG TBPK tablet   Dyspnea on exertion   No obvious shortness of breath. Described symptoms as chest heaviness.  Lung sounds were  diminished.  Hx of asthma, will treat with antibiotic and steroid.  No wheezing. Medrol  dose pack for 6 days.  Follow-up as needed.        Meds ordered this encounter  Medications   amoxicillin -clavulanate (AUGMENTIN ) 875-125 MG tablet    Sig: Take 1 tablet by mouth 2 (two) times daily.    Dispense:  20 tablet    Refill:  0    Supervising Provider:   METHENEY, CATHERINE D [[id number]]   methylPREDNISolone  (MEDROL  DOSEPAK) 4 MG TBPK tablet    Sig: Use as directed on package for 6 days.    Dispense:  21 tablet    Refill:  0    Supervising Provider:   METHENEY, CATHERINE D [[id number]]  Agrees with plan of care discussed.  Questions answered.   Return if symptoms worsen or fail to improve.  Darice JONELLE Brownie, FNP

## 2024-09-02 ENCOUNTER — Ambulatory Visit: Admitting: Physical Therapy

## 2024-09-10 ENCOUNTER — Other Ambulatory Visit (HOSPITAL_BASED_OUTPATIENT_CLINIC_OR_DEPARTMENT_OTHER): Payer: Self-pay

## 2024-09-10 ENCOUNTER — Other Ambulatory Visit: Payer: Self-pay | Admitting: Medical-Surgical

## 2024-09-10 ENCOUNTER — Other Ambulatory Visit: Payer: Self-pay

## 2024-09-10 DIAGNOSIS — F419 Anxiety disorder, unspecified: Secondary | ICD-10-CM

## 2024-09-13 ENCOUNTER — Other Ambulatory Visit (HOSPITAL_BASED_OUTPATIENT_CLINIC_OR_DEPARTMENT_OTHER): Payer: Self-pay

## 2024-09-13 ENCOUNTER — Encounter: Payer: Self-pay | Admitting: Obstetrics and Gynecology

## 2024-09-13 MED ORDER — VENLAFAXINE HCL ER 150 MG PO CP24
150.0000 mg | ORAL_CAPSULE | Freq: Every day | ORAL | 0 refills | Status: DC
  Filled 2024-09-13: qty 30, 30d supply, fill #0

## 2024-09-14 ENCOUNTER — Ambulatory Visit: Payer: Self-pay

## 2024-09-14 ENCOUNTER — Ambulatory Visit: Admitting: Obstetrics and Gynecology

## 2024-09-14 NOTE — Telephone Encounter (Signed)
 FYI Only or Action Required?: FYI only for provider: appointment scheduled on 09/15/24.  Patient was last seen in primary care on 08/30/2024 by Beth Darice SAUNDERS, FNP.  Called Nurse Triage reporting Shortness of Breath, Abdominal Pain, Rash, Mouth Lesions, Nausea, Dizziness, and Palpitations.  Symptoms began several weeks ago.  Interventions attempted: OTC medications: Benadryl , OTC mouth wash; prescription: albuterol  inhaler.  Symptoms are: stable.  Triage Disposition: See Physician Within 24 Hours (overriding See HCP Within 4 Hours (Or PCP Triage))  Patient/caregiver understands and will follow disposition?: Yes         Copied from CRM #[id number]. Topic: Clinical - Red Word Triage >> Sep 14, 2024  2:47 PM Beth Holmes wrote: Red Word that prompted transfer to Nurse Triage: Rash on back, face, chest.  Nausea, lightheadedness, stomach pain Reason for Disposition  [1] MILD difficulty breathing (e.g., minimal/no SOB at rest, SOB with walking, pulse < 100) AND [2] NEW-onset or WORSE than normal  Answer Assessment - Initial Assessment Questions 1. RESPIRATORY STATUS: Describe your breathing? (e.g., wheezing, shortness of breath, unable to speak, severe coughing)      Shortness of breath.  2. ONSET: When did this breathing problem begin?      X couple of weeks.  3. PATTERN Does the difficult breathing come and go, or has it been constant since it started?      Comes and goes.  4. SEVERITY: How bad is your breathing? (e.g., mild, moderate, severe)      Mild. Patient able to speak in full sentences without any labored breathing or gasping noted. No wheezing noted. She reports she feels SOB after talking for a long period or with activity such as walking.  5. RECURRENT SYMPTOM: Have you had difficulty breathing before? If Yes, ask: When was the last time? and What happened that time?      Yes, was treated to sinus infection 2-3 weeks ago but has been ongoing, states symptoms  improved but not fully resolved.  6. CARDIAC HISTORY: Do you have any history of heart disease? (e.g., heart attack, angina, bypass surgery, angioplasty)      No.  7. LUNG HISTORY: Do you have any history of lung disease?  (e.g., pulmonary embolus, asthma, emphysema)     No.  8. CAUSE: What do you think is causing the breathing problem?      Unsure.  9. OTHER SYMPTOMS: Do you have any other symptoms? (e.g., chest pain, cough, dizziness, fever, runny nose)     Extensive list of complaints/symptoms. Patient states she has had a respiratory illness with chest pain and lung pain x couple of weeks. She states the chest pain is a fluttering in her chest that lasted 3 days so she was seen on 11/19 at urgent care. She states no current chest pain or fluttering but she did have some fluttering earlier that resolved, and describes it as comes and goes. Rough, red, raised itchy bumpy rash that started last night to her back and chest. She also reports a rash to her face that looks like a butterfly to her cheeks splotches. Several sores in her mouth (lip, under tongue, both insides of cheeks) that are painful x few days. Dry mouth. She also mentions back pain that has not turned into abdominal pain and nausea that she thinks is a UTI, has been following with a urologist and states urinalysis does not show UTI. Lightheaded and wooziness that comes and goes and has been occurring for a couple of weeks. She  states chills, no fever. Cough has improved. Denies facial swelling or tongue swelling.  10. O2 SATURATION MONITOR:  Do you use an oxygen saturation monitor (pulse oximeter) at home? If Yes, ask: What is your reading (oxygen level) today? What is your usual oxygen saturation reading? (e.g., 95%)       N/A. 11. PREGNANCY: Is there any chance you are pregnant? When was your last menstrual period?       N/A. 12. TRAVEL: Have you traveled out of the country in the last month? (e.g.,  travel history, exposures)       N/A.  Patient seen in urgent care twice and at [facility] for symptoms last month. Patient states she stopped the antibiotic she was placed on after a couple of days and then restarted due to she thought that was causing her abdominal pain and nausea but did not notice a difference. Treating rash with Benadryl  and states she has been using her albuterol  inhaler and rinsing her mouth after. Treating mouth sores with OTC mouth rinse/wash.  Protocols used: Breathing Difficulty-A-AH

## 2024-09-15 ENCOUNTER — Ambulatory Visit: Admitting: Physician Assistant

## 2024-09-15 ENCOUNTER — Other Ambulatory Visit (HOSPITAL_BASED_OUTPATIENT_CLINIC_OR_DEPARTMENT_OTHER): Payer: Self-pay

## 2024-09-15 VITALS — BP 113/93 | HR 70 | Ht 62.0 in | Wt 235.0 lb

## 2024-09-15 DIAGNOSIS — R251 Tremor, unspecified: Secondary | ICD-10-CM | POA: Diagnosis not present

## 2024-09-15 DIAGNOSIS — F419 Anxiety disorder, unspecified: Secondary | ICD-10-CM | POA: Diagnosis not present

## 2024-09-15 DIAGNOSIS — R000 Tachycardia, unspecified: Secondary | ICD-10-CM

## 2024-09-15 DIAGNOSIS — R21 Rash and other nonspecific skin eruption: Secondary | ICD-10-CM

## 2024-09-15 DIAGNOSIS — R1013 Epigastric pain: Secondary | ICD-10-CM | POA: Diagnosis not present

## 2024-09-15 DIAGNOSIS — R0789 Other chest pain: Secondary | ICD-10-CM | POA: Insufficient documentation

## 2024-09-15 DIAGNOSIS — R635 Abnormal weight gain: Secondary | ICD-10-CM | POA: Insufficient documentation

## 2024-09-15 DIAGNOSIS — Z87898 Personal history of other specified conditions: Secondary | ICD-10-CM

## 2024-09-15 MED ORDER — OMEPRAZOLE 40 MG PO CPDR
40.0000 mg | DELAYED_RELEASE_CAPSULE | Freq: Every day | ORAL | 0 refills | Status: AC
  Filled 2024-09-15: qty 90, 90d supply, fill #0

## 2024-09-15 MED ORDER — VENLAFAXINE HCL ER 150 MG PO CP24
150.0000 mg | ORAL_CAPSULE | Freq: Every day | ORAL | 1 refills | Status: AC
  Filled 2024-09-15 – 2024-10-15 (×2): qty 90, 90d supply, fill #0

## 2024-09-15 NOTE — Progress Notes (Signed)
 Acute Office Visit  Subjective:     Patient ID: Beth Holmes, female    DOB: May 04, 2001, 23 y.o.   MRN: [id number]  Chief Complaint  Patient presents with   Cough   Shortness of Breath    HPI .SABRADiscussed the use of AI scribe software for clinical note transcription with the patient, who gave verbal consent to proceed.  History of Present Illness Beth Holmes is a 23 year old female with asthma and endometriosis who presents with persistent chest pain and shortness of breath.  Respiratory symptoms - Persistent chest pain described as sharp and pressure-like, occurring in episodes - Shortness of breath, especially during physical activity and when feeding farm animals - Cold-like symptoms since the end of October, currently less severe but still present - Intermittent hoarseness of voice - Cough has improved, but chest pain persists - Asthma with use of rescue inhaler only, not on daily controller medication - Chest X-ray reported as normal - EKG performed two weeks ago  Gastrointestinal symptoms - Severe stomach pain, primarily in the epigastric region, persists even when not taking antibiotics - Nausea and diarrhea, especially after eating - History of stomach ulcers - Previously on stomach acid suppressants, not currently taking them - Did not complete initial antibiotic course due to severe stomach pain, but resumed recently  Gynecologic and pelvic pain - History of endometriosis - Sharp lower abdominal pain, suspicious for ovarian cyst  Cardiac and neurologic symptoms - Episodes of heart fluttering - Shaking in both hands, initially attributed to prednisone  use but persists after discontinuation  Dermatologic and mucosal symptoms - Very dry mouth leading to sores - Recent rash on back and face, improved with Benadryl   Constitutional symptoms - Significant weight gain - Swelling in legs - Feels generally unwell with symptoms occurring in flare-ups -  History of prolonged respiratory illnesses     ROS See HPI.      Objective:    BP (!) 113/93   Pulse 70   Ht 5' 2 (1.575 m)   Wt 235 lb (106.6 kg)   LMP 08/26/2024   SpO2 99%   BMI 42.98 kg/m  BP Readings from Last 3 Encounters:  09/15/24 (!) 113/93  08/30/24 124/72  08/30/24 123/84   Wt Readings from Last 3 Encounters:  09/15/24 235 lb (106.6 kg)  08/30/24 235 lb (106.6 kg)  07/08/24 241 lb (109.3 kg)    Physical Exam Constitutional:      Appearance: She is well-developed. She is obese.  HENT:     Head: Normocephalic.     Mouth/Throat:     Mouth: Mucous membranes are moist.     Pharynx: Oropharynx is clear.  Eyes:     Pupils: Pupils are equal, round, and reactive to light.  Cardiovascular:     Rate and Rhythm: Normal rate and regular rhythm.  Pulmonary:     Breath sounds: Normal breath sounds. No decreased breath sounds, wheezing, rhonchi or rales.     Comments: During 6 minute text became very short of breath. She seems short of breath while talking.  Chest:     Chest wall: No mass, tenderness or edema.  Abdominal:     General: Bowel sounds are normal.     Palpations: There is no mass.     Tenderness: There is no abdominal tenderness. There is no guarding or rebound.  Musculoskeletal:        General: Normal range of motion.     Cervical back: Normal  range of motion.     Right lower leg: No tenderness. No edema.     Left lower leg: No tenderness. No edema.  Skin:    Comments: Flushed cheeks.   Neurological:     General: No focal deficit present.     Mental Status: She is alert.  Psychiatric:        Mood and Affect: Mood is anxious.         Assessment & Plan:  SABRASABRATunya was seen today for cough and shortness of breath.  Diagnoses and all orders for this visit:  Tremor of both hands -     H. pylori breath test -     CMP14+EGFR -     TSH + free T4 -     CBC w/Diff/Platelet -     Fe+TIBC+Fer -     ANA,IFA RA Diag Pnl w/rflx Tit/Patn -      B12 and Folate Panel -     VITAMIN D  25 Hydroxy (Vit-D Deficiency, Fractures) -     Sed Rate (ESR) -     C-reactive protein -     D-dimer, quantitative -     Cortisol, urine, free -     Lipase -     FANA Staining Patterns  Atypical chest pain -     H. pylori breath test -     CMP14+EGFR -     TSH + free T4 -     CBC w/Diff/Platelet -     Fe+TIBC+Fer -     ANA,IFA RA Diag Pnl w/rflx Tit/Patn -     B12 and Folate Panel -     VITAMIN D  25 Hydroxy (Vit-D Deficiency, Fractures) -     Sed Rate (ESR) -     C-reactive protein -     D-dimer, quantitative -     Cortisol, urine, free -     Lipase -     omeprazole  (PRILOSEC) 40 MG capsule; Take 1 capsule (40 mg total) by mouth daily. -     FANA Staining Patterns  Epigastric pain -     H. pylori breath test -     CMP14+EGFR -     TSH + free T4 -     CBC w/Diff/Platelet -     Fe+TIBC+Fer -     ANA,IFA RA Diag Pnl w/rflx Tit/Patn -     B12 and Folate Panel -     VITAMIN D  25 Hydroxy (Vit-D Deficiency, Fractures) -     Sed Rate (ESR) -     C-reactive protein -     D-dimer, quantitative -     Cortisol, urine, free -     Lipase -     omeprazole  (PRILOSEC) 40 MG capsule; Take 1 capsule (40 mg total) by mouth daily. -     FANA Staining Patterns  History of ulcer disease -     H. pylori breath test -     CMP14+EGFR -     TSH + free T4 -     CBC w/Diff/Platelet -     Fe+TIBC+Fer -     ANA,IFA RA Diag Pnl w/rflx Tit/Patn -     B12 and Folate Panel -     VITAMIN D  25 Hydroxy (Vit-D Deficiency, Fractures) -     Sed Rate (ESR) -     C-reactive protein -     D-dimer, quantitative -     Cortisol, urine, free -     Lipase -  omeprazole  (PRILOSEC) 40 MG capsule; Take 1 capsule (40 mg total) by mouth daily. -     FANA Staining Patterns  Morbid obesity (HCC) -     H. pylori breath test -     CMP14+EGFR -     TSH + free T4 -     CBC w/Diff/Platelet -     Fe+TIBC+Fer -     ANA,IFA RA Diag Pnl w/rflx Tit/Patn -     B12 and Folate  Panel -     VITAMIN D  25 Hydroxy (Vit-D Deficiency, Fractures) -     Sed Rate (ESR) -     C-reactive protein -     D-dimer, quantitative -     Cortisol, urine, free -     Lipase -     FANA Staining Patterns  Facial rash -     H. pylori breath test -     CMP14+EGFR -     TSH + free T4 -     CBC w/Diff/Platelet -     Fe+TIBC+Fer -     ANA,IFA RA Diag Pnl w/rflx Tit/Patn -     B12 and Folate Panel -     VITAMIN D  25 Hydroxy (Vit-D Deficiency, Fractures) -     Sed Rate (ESR) -     C-reactive protein -     D-dimer, quantitative -     Cortisol, urine, free -     Lipase -     FANA Staining Patterns  Abnormal weight gain -     H. pylori breath test -     CMP14+EGFR -     TSH + free T4 -     CBC w/Diff/Platelet -     Fe+TIBC+Fer -     ANA,IFA RA Diag Pnl w/rflx Tit/Patn -     B12 and Folate Panel -     VITAMIN D  25 Hydroxy (Vit-D Deficiency, Fractures) -     Sed Rate (ESR) -     C-reactive protein -     D-dimer, quantitative -     Cortisol, urine, free -     Lipase -     FANA Staining Patterns  Anxiety -     H. pylori breath test -     CMP14+EGFR -     TSH + free T4 -     CBC w/Diff/Platelet -     Fe+TIBC+Fer -     ANA,IFA RA Diag Pnl w/rflx Tit/Patn -     B12 and Folate Panel -     VITAMIN D  25 Hydroxy (Vit-D Deficiency, Fractures) -     Sed Rate (ESR) -     C-reactive protein -     D-dimer, quantitative -     Cortisol, urine, free -     venlafaxine  XR (EFFEXOR  XR) 150 MG 24 hr capsule; Take 1 capsule (150 mg total) by mouth daily with breakfast. -     Lipase -     FANA Staining Patterns  Tachycardia -     FANA Staining Patterns   Assessment & Plan Epigastric pain with upper gastrointestinal symptoms (including diarrhea, nausea, and dry mouth with oral ulcers) Persistent epigastric pain with diarrhea, nausea, and dry mouth with oral ulcers, possibly due to gastritis or H. pylori infection. Symptoms may be exacerbated by recent medication use. - Ordered H.  pylori breath test. - Ordered blood work to assess for gastritis and other potential causes. - Prescribed omeprazole  to start daily.   Chest pain  with exertional dyspnea and tachycardia Intermittent sharp chest pain with exertional dyspnea and tachycardia. Previous EKG and chest x-ray normal. Possible asthma or gastrointestinal reflux. Cardiac causes considered but initial tests reassuring. - Ordered heart monitor. - Ordered blood work. - Consider CT of the chest if D-dimer elevated.  Tremor New onset bilateral hand tremor, possibly related to recent medication use or other conditions. Symptoms persisted despite cessation of prednisone . - Ordered blood work to evaluate for potential causes.  Morbid obesity with abnormal weight gain and lower extremity edema Significant weight gain and lower extremity edema, possibly related to recent illness or medication changes. Potential link to birth control use. - Ordered urine cortisol test.  Asthma Exertional dyspnea. Currently using a rescue inhaler only. - Continue current asthma management with rescue inhaler. - lungs sounded great today, no wheezing.   Rash Recent rash with itching, possibly related to medication or other conditions. Previous lupus test negative. - Ordered ANA test.  Endometriosis with possible ovarian cyst Endometriosis with possible ovarian cyst, presenting with sharp pelvic pain. Symptoms may be related to endometriosis or ovarian cysts. - Continue to monitor symptoms and consider further evaluation if symptoms persist or worsen.   Spent 40 minutes in chart review and coordination of care.   Janos Shampine, PA-C

## 2024-09-15 NOTE — Patient Instructions (Signed)
 Get labs Start omeprazole  daily.  Will order heart monitor

## 2024-09-17 ENCOUNTER — Ambulatory Visit: Payer: Self-pay | Admitting: Physician Assistant

## 2024-09-17 ENCOUNTER — Ambulatory Visit

## 2024-09-17 ENCOUNTER — Encounter: Payer: Self-pay | Admitting: Physician Assistant

## 2024-09-17 NOTE — Progress Notes (Signed)
 Beth Holmes,   Vitamin D  low. How much are you taking daily?   Albumin low. Make sure getting enough protein in diet. 60g a day!   Thyroid  looks good.   Iron looks good.   B12 normal.   D-dimer normal.   Lipase normal.   ANA, pending. Your inflammatory markers are high indicating inflammation in body.

## 2024-09-18 LAB — CMP14+EGFR
ALT: 12 IU/L (ref 0–32)
AST: 10 IU/L (ref 0–40)
Albumin: 3.6 g/dL — ABNORMAL LOW (ref 4.0–5.0)
Alkaline Phosphatase: 60 IU/L (ref 41–116)
BUN/Creatinine Ratio: 21 (ref 9–23)
BUN: 13 mg/dL (ref 6–20)
Bilirubin Total: 0.2 mg/dL (ref 0.0–1.2)
CO2: 20 mmol/L (ref 20–29)
Calcium: 8.8 mg/dL (ref 8.7–10.2)
Chloride: 107 mmol/L — ABNORMAL HIGH (ref 96–106)
Creatinine, Ser: 0.63 mg/dL (ref 0.57–1.00)
Globulin, Total: 2.5 g/dL (ref 1.5–4.5)
Glucose: 89 mg/dL (ref 70–99)
Potassium: 4.4 mmol/L (ref 3.5–5.2)
Sodium: 142 mmol/L (ref 134–144)
Total Protein: 6.1 g/dL (ref 6.0–8.5)
eGFR: 129 mL/min/1.73 (ref >59)

## 2024-09-18 LAB — CBC WITH DIFFERENTIAL/PLATELET
Basophils Absolute: 0 x10E3/uL (ref 0.0–0.2)
Basos: 0 %
EOS (ABSOLUTE): 0.2 x10E3/uL (ref 0.0–0.4)
Eos: 2 %
Hematocrit: 39.3 % (ref 34.0–46.6)
Hemoglobin: 12.5 g/dL (ref 11.1–15.9)
Immature Grans (Abs): 0 x10E3/uL (ref 0.0–0.1)
Immature Granulocytes: 0 %
Lymphocytes Absolute: 3.9 x10E3/uL — ABNORMAL HIGH (ref 0.7–3.1)
Lymphs: 40 %
MCH: 25.9 pg — ABNORMAL LOW (ref 26.6–33.0)
MCHC: 31.8 g/dL (ref 31.5–35.7)
MCV: 82 fL (ref 79–97)
Monocytes Absolute: 0.7 x10E3/uL (ref 0.1–0.9)
Monocytes: 8 %
Neutrophils Absolute: 5 x10E3/uL (ref 1.4–7.0)
Neutrophils: 50 %
Platelets: 293 x10E3/uL (ref 150–450)
RBC: 4.82 x10E6/uL (ref 3.77–5.28)
RDW: 13.7 % (ref 11.7–15.4)
WBC: 9.9 x10E3/uL (ref 3.4–10.8)

## 2024-09-18 LAB — IRON,TIBC AND FERRITIN PANEL
Ferritin: 84 ng/mL (ref 15–150)
Iron Saturation: 19 % (ref 15–55)
Iron: 58 ug/dL (ref 27–159)
Total Iron Binding Capacity: 304 ug/dL (ref 250–450)
UIBC: 246 ug/dL (ref 131–425)

## 2024-09-18 LAB — ANA,IFA RA DIAG PNL W/RFLX TIT/PATN
ANA Titer 1: POSITIVE — AB
Cyclic Citrullin Peptide Ab: 6 U (ref 0–19)
Rheumatoid fact SerPl-aCnc: 10 [IU]/mL (ref <14.0)

## 2024-09-18 LAB — FANA STAINING PATTERNS: Speckled Pattern: 1:80 {titer}

## 2024-09-18 LAB — TSH+FREE T4
Free T4: 1.25 ng/dL (ref 0.82–1.77)
TSH: 1.5 u[IU]/mL (ref 0.450–4.500)

## 2024-09-18 LAB — SEDIMENTATION RATE: Sed Rate: 54 mm/h — ABNORMAL HIGH (ref 0–32)

## 2024-09-18 LAB — D-DIMER, QUANTITATIVE: D-DIMER: <0.2 mg{FEU}/L (ref 0.00–0.49)

## 2024-09-18 LAB — LIPASE: Lipase: 25 U/L (ref 14–72)

## 2024-09-18 LAB — B12 AND FOLATE PANEL
Folate: >20 ng/mL (ref >3.0)
Vitamin B-12: 398 pg/mL (ref 232–1245)

## 2024-09-18 LAB — VITAMIN D 25 HYDROXY (VIT D DEFICIENCY, FRACTURES): Vit D, 25-Hydroxy: 25.7 ng/mL — ABNORMAL LOW (ref 30.0–100.0)

## 2024-09-18 LAB — C-REACTIVE PROTEIN: CRP: 27 mg/L — ABNORMAL HIGH (ref 0–10)

## 2024-09-18 LAB — H. PYLORI BREATH TEST

## 2024-09-20 ENCOUNTER — Encounter: Payer: Self-pay | Admitting: Physician Assistant

## 2024-09-20 ENCOUNTER — Ambulatory Visit: Attending: Physician Assistant

## 2024-09-20 DIAGNOSIS — R000 Tachycardia, unspecified: Secondary | ICD-10-CM

## 2024-09-20 DIAGNOSIS — R0789 Other chest pain: Secondary | ICD-10-CM

## 2024-09-20 NOTE — Addendum Note (Signed)
 Addended by: ANTONIETTE VERMELL CROME on: 09/20/2024 08:06 AM   Modules accepted: Orders

## 2024-09-20 NOTE — Progress Notes (Unsigned)
 EP to read.

## 2024-09-20 NOTE — Progress Notes (Signed)
 ANA positive but a very weak titer meaning likely false positive. Should be reaching out to send zio patch monitor for heart.  H.pylori negative.   I will also place cardiology referral.

## 2024-09-22 ENCOUNTER — Other Ambulatory Visit (HOSPITAL_BASED_OUTPATIENT_CLINIC_OR_DEPARTMENT_OTHER): Payer: Self-pay

## 2024-09-22 ENCOUNTER — Ambulatory Visit: Admitting: Physician Assistant

## 2024-09-22 ENCOUNTER — Encounter: Payer: Self-pay | Admitting: Physician Assistant

## 2024-09-22 ENCOUNTER — Ambulatory Visit: Payer: Self-pay | Admitting: Physician Assistant

## 2024-09-22 ENCOUNTER — Ambulatory Visit (INDEPENDENT_AMBULATORY_CARE_PROVIDER_SITE_OTHER)

## 2024-09-22 VITALS — BP 135/85 | HR 85 | Ht 62.0 in | Wt 235.0 lb

## 2024-09-22 DIAGNOSIS — Z8742 Personal history of other diseases of the female genital tract: Secondary | ICD-10-CM

## 2024-09-22 DIAGNOSIS — R1032 Left lower quadrant pain: Secondary | ICD-10-CM

## 2024-09-22 DIAGNOSIS — M545 Low back pain, unspecified: Secondary | ICD-10-CM | POA: Diagnosis not present

## 2024-09-22 DIAGNOSIS — R0609 Other forms of dyspnea: Secondary | ICD-10-CM

## 2024-09-22 DIAGNOSIS — R42 Dizziness and giddiness: Secondary | ICD-10-CM

## 2024-09-22 DIAGNOSIS — R911 Solitary pulmonary nodule: Secondary | ICD-10-CM

## 2024-09-22 DIAGNOSIS — R053 Chronic cough: Secondary | ICD-10-CM

## 2024-09-22 DIAGNOSIS — R0789 Other chest pain: Secondary | ICD-10-CM

## 2024-09-22 DIAGNOSIS — R112 Nausea with vomiting, unspecified: Secondary | ICD-10-CM

## 2024-09-22 DIAGNOSIS — G43909 Migraine, unspecified, not intractable, without status migrainosus: Secondary | ICD-10-CM

## 2024-09-22 DIAGNOSIS — R251 Tremor, unspecified: Secondary | ICD-10-CM

## 2024-09-22 MED ORDER — METOPROLOL SUCCINATE ER 25 MG PO TB24
25.0000 mg | ORAL_TABLET | Freq: Every day | ORAL | 1 refills | Status: DC
  Filled 2024-09-22: qty 30, 30d supply, fill #0
  Filled 2024-10-15: qty 30, 30d supply, fill #1

## 2024-09-22 MED ORDER — PROCHLORPERAZINE MALEATE 10 MG PO TABS
10.0000 mg | ORAL_TABLET | Freq: Four times a day (QID) | ORAL | 1 refills | Status: AC | PRN
  Filled 2024-09-22: qty 30, 8d supply, fill #0

## 2024-09-22 MED ORDER — RIZATRIPTAN BENZOATE 5 MG PO TBDP
5.0000 mg | ORAL_TABLET | ORAL | 5 refills | Status: AC | PRN
  Filled 2024-09-22: qty 10, 5d supply, fill #0

## 2024-09-22 NOTE — Progress Notes (Unsigned)
 Established Patient Office Visit  Subjective   Patient ID: Beth Holmes, female    DOB: Dec 07, 2000  Age: 23 y.o. MRN: [id number]  Chief Complaint  Patient presents with   Medical Management of Chronic Issues    HPI .SABRADiscussed the use of AI scribe software for clinical note transcription with the patient, who gave verbal consent to proceed.  History of Present Illness Beth Holmes is a 23 year old female with endometriosis who presents with worsening urinary and gastrointestinal symptoms.  Urinary symptoms - Urinary symptoms resembling urinary tract infection, including dysuria and back pain. - Incomplete bladder emptying identified by urologist; confirmed by bladder scan and catheterization with 100 mL residual urine removed. - Cystoscopy revealed bladder inflammation, etiology unclear. - Frequent urination and nocturia 3-5 times per night. - Sensation of incomplete bladder emptying persists. - Daily use of pelvic Valium  cream for symptom management. - Trial of medication for overactive bladder for 1-2 weeks without significant improvement. - Concern about possible recurrence of ovarian cyst due to sharp lower abdominal pain.  Pelvic pain and endometriosis - Significant pelvic pain impacting quality of life and sexual activity, contributing to relationship issues. - History of endometriosis diagnosed via laparoscopic surgery in 2022. - Two prior surgeries: 2016 for 10 cm ovarian cyst, 2022 for endometriosis excision. - Currently on birth control for symptom management. - Severe back pain originating in the lower back and radiating upwards, suspected to be related to endometriosis.  Gastrointestinal symptoms - Nausea, vomiting, and alternating diarrhea and constipation. - Vomiting occurs postprandially, with sensation of food retention prior to regurgitation. - History of stomach ulcers previously treated with medication, not currently on therapy. - Similar  gastrointestinal symptoms in the past led to discovery of ovarian cyst in 2016.  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 - Heart rate increases with minimal exertion, associated with nausea and lightheadedness. - Family history of postural orthostatic tachycardia syndrome (POTS); prior testing without significant findings. - Persistent cough and chest pain. - Family history of valvular heart conditions.  Gynecologic and oncologic concerns - History of abnormal Pap smear results. - Family history of ovarian and cervical cancers. - Concern about bladder issues and possible recurrence of ovarian cyst due to sharp lower abdominal pain.   ROS See HPI.    Objective:     BP 135/85   Pulse 85   Ht 5' 2 (1.575 m)   Wt 235 lb (106.6 kg)   LMP 08/26/2024   SpO2 99%   BMI 42.98 kg/m  BP Readings from Last 3 Encounters:  09/24/24 118/78  09/22/24 135/85  09/15/24 (!) 113/93   Wt Readings from Last 3 Encounters:  09/24/24 248 lb 3.2 oz (112.6 kg)  09/22/24 235 lb (106.6 kg)  09/15/24 235 lb (106.6 kg)    Physical Exam Constitutional:      Appearance: Normal appearance. She is obese.  HENT:     Head: Normocephalic.  Cardiovascular:     Rate and Rhythm: Normal rate and regular rhythm.     Pulses: Normal pulses.     Heart sounds: Normal heart sounds.  Abdominal:     General: There is no distension.     Palpations: Abdomen is soft. There is no mass.     Tenderness: There is no abdominal tenderness. There is no right CVA tenderness, left CVA tenderness or guarding.  Musculoskeletal:     Right lower leg: No edema.  Left lower leg: No edema.     Comments: No pain to palpation over lumbar spine.   Skin:    Comments: Flushed cheeks.   Neurological:     General: No focal deficit present.     Mental Status: She is alert and oriented to person, place, and time.  Psychiatric:        Mood and Affect: Mood normal.        Assessment & Plan:  SABRASABRAKayleena was seen today for medical management of chronic issues.  Diagnoses and all orders for this visit:  Atypical chest pain -     CT Chest Wo Contrast; Future  Tremor of both hands  Migraine without status migrainosus, not intractable, unspecified migraine type -     rizatriptan  (MAXALT -MLT) 5 MG disintegrating tablet; Take 1 tablet (5 mg total) by mouth as needed for migraine. May repeat in 2 hours if needed  History of ovarian cyst -     US  Pelvic Complete With Transvaginal; Future  Left lower quadrant pain -     US  Pelvic Complete With Transvaginal; Future  Acute bilateral low back pain without sciatica -     US  Pelvic Complete With Transvaginal; Future -     DG Lumbar Spine Complete; Future  Persistent cough -     CT Chest Wo Contrast; Future  Dyspnea on exertion -     Cancel: ECHOCARDIOGRAM COMPLETE; Future  Nausea and vomiting, unspecified vomiting type -     prochlorperazine  (COMPAZINE ) 10 MG tablet; Take 1 tablet (10 mg total) by mouth every 6 (six) hours as needed for nausea or vomiting. -     Ambulatory referral to Gastroenterology  Postural dizziness -     metoprolol  succinate (TOPROL -XL) 25 MG 24 hr tablet; Take 1 tablet (25 mg total) by mouth daily. (Patient not taking: Reported on 09/24/2024)   Assessment & Plan Atypical chest pain with exertional tachycardia and dyspnea Intermittent chest pain with exertional tachycardia and dyspnea. Negative blood clot screening. Family history of prolapsed valve. Symptoms suggestive of possible circulatory dysregulation, possibly POTS. Heart rate spikes to 160-170 bpm with minimal exertion. Oxygen saturation drops to 92% during episodes of shortness of breath. Orthostatic vitals in office do not support POTS diagnosis - Ordered CT of the chest. - Referred to cardiology has already been placed and pt scheduled.  - Prescribed low dose beta blocker, metoprolol  25mg  daily.  - Ordered  echocardiogram.  Chronic nausea and vomiting with suspected gastroparesis Chronic nausea and vomiting with suspected gastroparesis. Symptoms include vomiting after eating, nausea, and inability to tolerate food. Previous GI evaluation showed small stomach ulcers. Symptoms suggestive of gastroparesis, but further evaluation needed. - Referred to gastroenterology. - Prescribed compazine  for nausea.  - Contiue on omeprazole  for GERD.   Bladder dysfunction with incomplete emptying and urinary frequency Bladder dysfunction with incomplete emptying and urinary frequency. Symptoms include back pain, burning with urination, and urinary incontinence. Previous evaluations suggest possible endometriosis involvement, but conflicting opinions from specialists. Current treatment includes pelvic floor therapy and medication for overactive bladder, with limited improvement.  Endometriosis with chronic pelvic pain and dyspareunia Endometriosis with chronic pelvic pain and dyspareunia. Previous surgeries for ovarian cysts and endometriosis. Current symptoms include severe pelvic pain and dyspareunia, affecting quality of life. Conflicting opinions on endometriosis involvement in bladder dysfunction. Current treatment includes pelvic cream and Valium  for pelvic pain. - Continue pelvic cream and Valium  for pelvic pain. - Will consider referral to endometriosis specialist.  Chronic back pain Chronic  back pain, possibly related to endometriosis. Pain radiates from lower back to middle back and between shoulder blades. Ibuprofen ineffective. Recent increase in severity over the past three months. Differential includes possible ovarian cyst recurrence. - Ordered x-ray of lower back. - Ordered ultrasound to evaluate for ovarian cyst recurrence.  Spent 50 minutes with patient in chart review and coordination of care.    Return in about 2 weeks (around 10/06/2024) for PCP- Joy.    Alexxus Sobh, PA-C

## 2024-09-22 NOTE — Progress Notes (Signed)
 No abnormalities found in the lumbar spine on imaging.

## 2024-09-22 NOTE — Patient Instructions (Signed)
 Restart omeprazole  daily. Will make referral for you to get back in with [facility].  Use compazine  for nausea as needed.  Eat small frequent meals and avoid large meals.   Will get CT of chest to evaluate cough and chest pain.   Will get echo due to shortness of breath with exertion.   Will order ultrasound today to evaluate for ovarian cyst.   Will get xray of lumbar spine to evaluate low back pain.   Start metoprolol  daily and keep cardiology referral.

## 2024-09-23 ENCOUNTER — Ambulatory Visit

## 2024-09-23 DIAGNOSIS — M545 Low back pain, unspecified: Secondary | ICD-10-CM

## 2024-09-23 DIAGNOSIS — R1032 Left lower quadrant pain: Secondary | ICD-10-CM

## 2024-09-23 DIAGNOSIS — Z8742 Personal history of other diseases of the female genital tract: Secondary | ICD-10-CM

## 2024-09-24 ENCOUNTER — Encounter: Payer: Self-pay | Admitting: Physician Assistant

## 2024-09-24 ENCOUNTER — Ambulatory Visit: Attending: Cardiology | Admitting: Physician Assistant

## 2024-09-24 VITALS — BP 118/78 | HR 80 | Ht 62.0 in | Wt 248.2 lb

## 2024-09-24 DIAGNOSIS — R0609 Other forms of dyspnea: Secondary | ICD-10-CM | POA: Diagnosis not present

## 2024-09-24 DIAGNOSIS — R000 Tachycardia, unspecified: Secondary | ICD-10-CM | POA: Diagnosis not present

## 2024-09-24 DIAGNOSIS — R0789 Other chest pain: Secondary | ICD-10-CM

## 2024-09-24 NOTE — Progress Notes (Signed)
 Small benign cyst of right ovary.

## 2024-09-24 NOTE — Progress Notes (Signed)
 Cardiology Office Note   Date:  09/26/2024  ID:  [name], DOB October 09, 2001, MRN [id number] PCP: Beth Mini, NP  [facility] Providers Cardiologist:  [facility]  History of Present Illness Beth Holmes is a 23 y.o. female was past medical history of asthma and endometriosis who was referred to cardiology service for evaluation of persistent chest pain and shortness of breath.  Symptom has been going on since October.  She also has significant stomach pain in the epigastric area and nausea and diarrhea.  Patient also described episode of heart fluttering.  She was seen by her PCP in early December.  ANA titer was positive, C-reactive protein and sed rate were both elevated.  Renal function and electrolyte normal.  Vitamin D  level mildly low.  Folate and B12 level normal.  D-dimer negative.  Iron level normal.  H. pylori breath test negative.  Lipase normal.  TSH and free T4 normal.  She was placed on omeprazole  40 mg daily and Effexor .  Transvaginal pelvic ultrasound showed probable 1.8 cm follicular cyst in the right ovary, no follow-up imaging recommended.  Normal lumbar spine x-ray.  Her PCP has ordered a CT of the chest without contrast, this is waiting to be scheduled.  Echocardiogram was also ordered.  Patient was referred to GI service.  Metoprolol  succinate 25 mg daily was started prior to cardiology referral.  Patient presents today for her [facility] evaluation.  She says her symptoms started in October after what appears to be a viral illness.  She was seen at [facility] and was given prednisone .  Since then, she started having increasing dyspnea with exertion and chest tightness with activity.  She also noticed her heart rate would jump up significantly with any activity.  She described nausea and vomiting after food and the restless leg for the past several months.  She has underwent extensive evaluation by her PCP.  Abnormal blood work including ESR, CRP  and ANA titer.  Her PCP has already ordered heart monitor and echocardiogram.  I will have the echocardiogram bounce back to cardiology service to make sure she does not have postmyocardial myopathy or large pericardial effusion.  She is currently wearing the heart monitor.  She has now started on the metoprolol  succinate which I encouraged her to.  I plan to see the patient back in 6 weeks for reassessment.  ROS:   Patient complains of chest pain, shortness of breath, increased palpitation, nausea and vomiting.  She has no lower extremity edema, orthopnea or PND.  Studies Reviewed EKG Interpretation Date/Time:  Friday September 24 2024 15:04:55 EST Ventricular Rate:  80 PR Interval:  130 QRS Duration:  76 QT Interval:  360 QTC Calculation: 415 R Axis:   12  Text Interpretation: Normal sinus rhythm Cannot rule out Anterior infarct , age undetermined When compared with ECG of 19-Jan-2021 23:54, PREVIOUS ECG IS PRESENT Confirmed by Janene Boer [id number]) on 09/24/2024 3:15:55 PM        Risk Assessment/Calculations          Physical Exam VS:  BP 118/78   Pulse 80   Ht 5' 2 (1.575 m)   Wt 248 lb 3.2 oz (112.6 kg)   LMP 08/26/2024   SpO2 98%   BMI 45.40 kg/m        Wt Readings from Last 3 Encounters:  09/24/24 248 lb 3.2 oz (112.6 kg)  09/22/24 235 lb (106.6 kg)  09/15/24 235 lb (106.6 kg)  GEN: Well nourished, well developed in no acute distress NECK: No JVD; No carotid bruits CARDIAC: RRR, no murmurs, rubs, gallops RESPIRATORY:  Clear to auscultation without rales, wheezing or rhonchi  ABDOMEN: Soft, non-tender, non-distended EXTREMITIES:  No edema; No deformity   ASSESSMENT AND PLAN  Dyspnea on exertion: Started since October.  ANA, sed rate and CRP abnormal.  Differential diagnosis including viral induced cardiomyopathy, pericardial effusion or rheumatological disorder.  Will obtain echocardiogram  Palpitation: She described significant increase in heart rate with  minimal activity, pending echocardiogram and heart monitor.   Atypical chest pain: Will follow-up on echocardiogram.       Dispo: Follow-up in 6 weeks  Signed, Beth Sawtelle, PA

## 2024-09-24 NOTE — Patient Instructions (Addendum)
 Medication Instructions:  Your physician has recommended you make the following change in your medication:   START the Metoprolol  that your primary care ordered  *If you need a refill on your cardiac medications before your next appointment, please call your pharmacy*  Lab Work: None ordered  If you have labs (blood work) drawn today and your tests are completely normal, you will receive your results only by: MyChart Message (if you have MyChart) OR A paper copy in the mail If you have any lab test that is abnormal or we need to change your treatment, we will call you to review the results.  Testing/Procedures: Your physician has requested that you have an echocardiogram WITHIN 2 WEEKS Echocardiography is a painless test that uses sound waves to create images of your heart. It provides your doctor with information about the size and shape of your heart and how well your hearts chambers and valves are working. This procedure takes approximately one hour. There are no restrictions for this procedure. Please do NOT wear cologne, perfume, aftershave, or lotions (deodorant is allowed). Please arrive 15 minutes prior to your appointment time.  Please note: We ask at that you not bring children with you during ultrasound (echo/ vascular) testing. Due to room size and safety concerns, children are not allowed in the ultrasound rooms during exams. Our front office staff cannot provide observation of children in our lobby area while testing is being conducted. An adult accompanying a patient to their appointment will only be allowed in the ultrasound room at the discretion of the ultrasound technician under special circumstances. We apologize for any inconvenience.   Follow-Up: At [facility], you and your health needs are our priority.  As part of our continuing mission to provide you with exceptional heart care, our providers are all part of one team.  This team includes your primary  Cardiologist (physician) and Advanced Practice Providers or APPs (Physician Assistants and Nurse Practitioners) who all work together to provide you with the care you need, when you need it.  Your next appointment:   6 week(s)  Provider:   Scot Ford, PA-C          We recommend signing up for the patient portal called MyChart.  Sign up information is provided on this After Visit Summary.  MyChart is used to connect with patients for Virtual Visits (Telemedicine).  Patients are able to view lab/test results, encounter notes, upcoming appointments, etc.  Non-urgent messages can be sent to your provider as well.   To learn more about what you can do with MyChart, go to [email or web address].   Other Instructions

## 2024-09-27 ENCOUNTER — Ambulatory Visit (HOSPITAL_COMMUNITY)
Admission: RE | Admit: 2024-09-27 | Discharge: 2024-09-27 | Disposition: A | Source: Ambulatory Visit | Attending: Physician Assistant

## 2024-09-27 DIAGNOSIS — R0600 Dyspnea, unspecified: Secondary | ICD-10-CM | POA: Diagnosis not present

## 2024-09-27 DIAGNOSIS — R0789 Other chest pain: Secondary | ICD-10-CM | POA: Insufficient documentation

## 2024-09-27 DIAGNOSIS — R0609 Other forms of dyspnea: Secondary | ICD-10-CM | POA: Diagnosis present

## 2024-09-27 DIAGNOSIS — R000 Tachycardia, unspecified: Secondary | ICD-10-CM | POA: Diagnosis present

## 2024-09-27 DIAGNOSIS — R079 Chest pain, unspecified: Secondary | ICD-10-CM

## 2024-09-28 ENCOUNTER — Encounter: Payer: Self-pay | Admitting: Medical-Surgical

## 2024-09-28 LAB — ECHOCARDIOGRAM COMPLETE
Area-P 1/2: 2.72 cm2
S' Lateral: 2.6 cm

## 2024-09-29 ENCOUNTER — Ambulatory Visit: Payer: Self-pay | Admitting: Physician Assistant

## 2024-09-29 ENCOUNTER — Other Ambulatory Visit

## 2024-09-29 ENCOUNTER — Ambulatory Visit (INDEPENDENT_AMBULATORY_CARE_PROVIDER_SITE_OTHER)

## 2024-09-29 ENCOUNTER — Other Ambulatory Visit: Payer: Self-pay | Admitting: Physician Assistant

## 2024-09-29 DIAGNOSIS — J45909 Unspecified asthma, uncomplicated: Secondary | ICD-10-CM

## 2024-09-29 DIAGNOSIS — R0789 Other chest pain: Secondary | ICD-10-CM

## 2024-09-29 DIAGNOSIS — R0600 Dyspnea, unspecified: Secondary | ICD-10-CM

## 2024-09-29 DIAGNOSIS — R053 Chronic cough: Secondary | ICD-10-CM

## 2024-09-29 NOTE — Telephone Encounter (Signed)
 I have called and spoke with the patient. Her echo showed normal EF, no pericardial effusion, no significant valve issue. The septal hypertrophy is mild. I have sent a message to our reader Dr. Raford, awaiting to hear back. But given lack of LVOT gradient, my suspicion is her septal hypertrophy is too mild to generate any symptom. She is wearing a heart monitor that will be completed in the next week. At this time, I think we need to think about how much her asthma contribute to SOB, I placed a order for full PFT, please arrange. Also reschedule her appt with me in January and have her see one of the cardiology attending such as Dr. Floretta, Dr. Kriste or Dr. Ren in early January by which point her PFT and heart monitor should be done.  Beth Holmes

## 2024-09-29 NOTE — Telephone Encounter (Signed)
 Spoke with pt. Appointment with Hao Meng, PA in January canceled and pt rescheduled to see Dr. Floretta 1/16. Pt aware that she will receive a call to schedule PFTs. Pt verbalized understanding. All questions if any were answered.

## 2024-09-29 NOTE — Progress Notes (Signed)
 I have called and spoken with Beth Holmes. Normal ejection fraction, no pericardial effusion, no significant valve issue. Mild septal hypertrophy but no mention of left ventricular outflow tract gradient, suspect this mild septal hypertrophy is not cutting off the blood flow enough to generate any symptom. I will send the message to our reader to confirm this.

## 2024-10-04 LAB — CORTISOL, URINE, FREE
Cortisol (Ur), Free: 16 ug/(24.h) (ref 6–42)
Cortisol,F,ug/L,U: 13 ug/L

## 2024-10-05 ENCOUNTER — Ambulatory Visit: Admitting: Medical-Surgical

## 2024-10-05 ENCOUNTER — Encounter: Payer: Self-pay | Admitting: Medical-Surgical

## 2024-10-05 VITALS — BP 120/79 | HR 82 | Temp 97.5°F | Resp 18 | Ht 62.0 in | Wt 249.1 lb

## 2024-10-05 DIAGNOSIS — N898 Other specified noninflammatory disorders of vagina: Secondary | ICD-10-CM | POA: Diagnosis not present

## 2024-10-05 DIAGNOSIS — Z118 Encounter for screening for other infectious and parasitic diseases: Secondary | ICD-10-CM

## 2024-10-05 DIAGNOSIS — R0609 Other forms of dyspnea: Secondary | ICD-10-CM

## 2024-10-05 DIAGNOSIS — R053 Chronic cough: Secondary | ICD-10-CM | POA: Diagnosis not present

## 2024-10-05 DIAGNOSIS — Z23 Encounter for immunization: Secondary | ICD-10-CM

## 2024-10-05 DIAGNOSIS — R300 Dysuria: Secondary | ICD-10-CM | POA: Diagnosis not present

## 2024-10-05 DIAGNOSIS — R0789 Other chest pain: Secondary | ICD-10-CM | POA: Diagnosis not present

## 2024-10-05 LAB — POCT URINALYSIS DIP (CLINITEK)
Bilirubin, UA: NEGATIVE
Glucose, UA: NEGATIVE mg/dL
Ketones, POC UA: NEGATIVE mg/dL
Leukocytes, UA: NEGATIVE
Nitrite, UA: NEGATIVE
POC PROTEIN,UA: NEGATIVE
Spec Grav, UA: 1.025
Urobilinogen, UA: 0.2 U/dL
pH, UA: 6

## 2024-10-05 NOTE — Progress Notes (Signed)
 Medical screening examination/treatment was performed by qualified clinical staff member and as supervising provider I was immediately available for consultation/collaboration. I have reviewed documentation and agree with assessment and plan.  [name], DNP, APRN, FNP-BC [facility]

## 2024-10-05 NOTE — Progress Notes (Addendum)
 "  Established Patient Office Visit  Subjective   Patient ID: Beth Holmes, female    DOB: Jan 29, 2001  Age: 23 y.o. MRN: [id number]  Chief Complaint  Patient presents with   Medical Management of Chronic Issues    Respiratory follow up    HPI  23 year old female presents for a 2 week for up on chest pain and shortness of breath  Patient states that she is continuing to have chest pain and shortness of breath. Describes chest pain as chest tightness which radiates to in between her shoulder blades. She states that she becomes short of breath with completing ADLs and is very fatigued by the end of the day. She has been using her Ventolin  inhaler every other day, and alternating tylenol  1000 mg/ibuprofen 800 mg once or twice per day with some relief. She has been seen by Cardiology and has had an echo completed which revealed mild left septal hypertrophy. Current plan by cardiology includes completing a PFT which is scheduled for tomorrow. Follow up with cardiology is scheduled for mid January.  Patient complains of having some vaginal irritation and discharge w/ slight odor, and burning with urination that started about a week ago. Is unsure if symptoms are related to her overactive bladder. Patient okay with checking for STDs as well. Completed antibiotic at the end of November. Denies fever or chills.  Review of Systems  Constitutional: Negative.   HENT: Negative.    Eyes: Negative.   Respiratory:  Positive for cough and shortness of breath.   Cardiovascular:  Positive for chest pain.  Gastrointestinal: Negative.   Genitourinary:  Positive for dysuria.       Vaginal irritation  Musculoskeletal: Negative.   Skin: Negative.   Neurological: Negative.   Endo/Heme/Allergies: Negative.   Psychiatric/Behavioral: Negative.        Objective:     BP 120/79 (BP Location: Left Arm, Patient Position: Sitting, Cuff Size: Normal)   Pulse 82   Temp (!) 97.5 F (36.4 C) (Oral)   Resp 18    Ht 5' 2 (1.575 m)   Wt 113 kg   SpO2 94%   BMI 45.56 kg/m  BP Readings from Last 3 Encounters:  10/05/24 120/79  09/24/24 118/78  09/22/24 135/85   Wt Readings from Last 3 Encounters:  10/05/24 113 kg  09/24/24 112.6 kg  09/22/24 106.6 kg      Physical Exam Vitals and nursing note reviewed.  Constitutional:      General: She is not in acute distress.    Appearance: Normal appearance.  Cardiovascular:     Rate and Rhythm: Normal rate and regular rhythm.     Pulses: Normal pulses.     Heart sounds: Normal heart sounds.  Pulmonary:     Effort: Pulmonary effort is normal.     Breath sounds: Normal breath sounds.  Neurological:     General: No focal deficit present.     Mental Status: She is alert and oriented to person, place, and time.  Psychiatric:        Mood and Affect: Mood normal.        Behavior: Behavior normal.        Thought Content: Thought content normal.        Judgment: Judgment normal.      No results found for any visits on 10/05/24.  Last CBC Lab Results  Component Value Date   WBC 9.9 09/15/2024   HGB 12.5 09/15/2024   HCT 39.3 09/15/2024  MCV 82 09/15/2024   MCH 25.9 (L) 09/15/2024   RDW 13.7 09/15/2024   PLT 293 09/15/2024   Last metabolic panel Lab Results  Component Value Date   GLUCOSE 89 09/15/2024   NA 142 09/15/2024   K 4.4 09/15/2024   CL 107 (H) 09/15/2024   CO2 20 09/15/2024   BUN 13 09/15/2024   CREATININE 0.63 09/15/2024   EGFR 129 09/15/2024   CALCIUM 8.8 09/15/2024   PROT 6.1 09/15/2024   ALBUMIN 3.6 (L) 09/15/2024   LABGLOB 2.5 09/15/2024   BILITOT 0.2 09/15/2024   ALKPHOS 60 09/15/2024   AST 10 09/15/2024   ALT 12 09/15/2024   ANIONGAP 7 01/01/2024   Last lipids Lab Results  Component Value Date   CHOL 196 (H) 05/11/2021   HDL 43 (L) 05/11/2021   LDLCALC 134 (H) 05/11/2021   TRIG 91 (H) 05/11/2021   CHOLHDL 4.6 05/11/2021   Last hemoglobin A1c Lab Results  Component Value Date   HGBA1C 5.5  06/23/2024      The ASCVD Risk score (Arnett DK, et al., 2019) failed to calculate for the following reasons:   The 2019 ASCVD risk score is only valid for ages 50 to 64   * - Cholesterol units were assumed    Assessment & Plan:  1. Atypical chest pain (Primary) 2. Persistent cough 3. Dyspnea on exertion -Continue with plan in place by Cardiology with completing PFT on 12/24 and follow up with Cardiology mid January -Continue using inhaler as needed for shortness of breath  4. Vaginal irritation -vaginal swab collected to rule out infection - POCT URINALYSIS DIP (CLINITEK) - NuSwab Vaginitis Plus (VG+)  5. Dysuria -POCT urinalysis collected - positive trace intact blood - urine culture sent - POCT URINALYSIS DIP (CLINITEK) - NuSwab Vaginitis Plus (VG+)  6. Need for influenza vaccination -Flu vaccine given today - Flu vaccine trivalent PF, 6mos and older(Flulaval,Afluria,Fluarix,Fluzone)  7. Screening for chlamydial disease - Vaginal swab collected today - NuSwab Vaginitis Plus (VG+)  8. Morbid obesity (HCC) -Recent unexplained weight gain -Interested in pharmaceutical option, but due to insurance unable to obtain -Discussed exercise, but unable to due to shortness of breath    Return if symptoms worsen or fail to improve.    Derrek JINNY Freund, NP Student  "

## 2024-10-06 ENCOUNTER — Ambulatory Visit (HOSPITAL_COMMUNITY)
Admission: RE | Admit: 2024-10-06 | Discharge: 2024-10-06 | Disposition: A | Discharge status: Home / Self Care | Source: Ambulatory Visit | Attending: Physician Assistant | Admitting: Physician Assistant

## 2024-10-06 DIAGNOSIS — R0600 Dyspnea, unspecified: Secondary | ICD-10-CM | POA: Diagnosis present

## 2024-10-06 DIAGNOSIS — J45909 Unspecified asthma, uncomplicated: Secondary | ICD-10-CM | POA: Diagnosis present

## 2024-10-06 LAB — PULMONARY FUNCTION TEST
DL/VA % pred: 88 %
DL/VA: 4.23 ml/min/mmHg/L
DLCO unc % pred: 90 %
DLCO unc: 18.74 ml/min/mmHg
FEF 25-75 Post: 3.77 L/s
FEF 25-75 Pre: 3.15 L/s
FEF2575-%Change-Post: 19 %
FEF2575-%Pred-Post: 105 %
FEF2575-%Pred-Pre: 87 %
FEV1-%Change-Post: 6 %
FEV1-%Pred-Post: 101 %
FEV1-%Pred-Pre: 94 %
FEV1-Post: 3.15 L
FEV1-Pre: 2.95 L
FEV1FVC-%Change-Post: 6 %
FEV1FVC-%Pred-Pre: 97 %
FEV6-%Change-Post: 0 %
FEV6-%Pred-Post: 99 %
FEV6-%Pred-Pre: 99 %
FEV6-Post: 3.53 L
FEV6-Pre: 3.52 L
FEV6FVC-%Pred-Post: 99 %
FEV6FVC-%Pred-Pre: 99 %
FVC-%Change-Post: 0 %
FVC-%Pred-Post: 99 %
FVC-%Pred-Pre: 99 %
FVC-Post: 3.53 L
FVC-Pre: 3.52 L
Post FEV1/FVC ratio: 89 %
Post FEV6/FVC ratio: 100 %
Pre FEV1/FVC ratio: 84 %
Pre FEV6/FVC Ratio: 100 %
RV % pred: 67 %
RV: 0.76 L
TLC % pred: 95 %
TLC: 4.53 L

## 2024-10-06 MED ORDER — ALBUTEROL SULFATE (2.5 MG/3ML) 0.083% IN NEBU
2.5000 mg | INHALATION_SOLUTION | Freq: Once | RESPIRATORY_TRACT | Status: AC
  Administered 2024-10-06: 2.5 mg via RESPIRATORY_TRACT

## 2024-10-08 ENCOUNTER — Ambulatory Visit: Payer: Self-pay | Admitting: Medical-Surgical

## 2024-10-08 DIAGNOSIS — R7982 Elevated C-reactive protein (CRP): Secondary | ICD-10-CM

## 2024-10-08 DIAGNOSIS — R7689 Other specified abnormal immunological findings in serum: Secondary | ICD-10-CM

## 2024-10-08 DIAGNOSIS — R700 Elevated erythrocyte sedimentation rate: Secondary | ICD-10-CM

## 2024-10-08 DIAGNOSIS — K1379 Other lesions of oral mucosa: Secondary | ICD-10-CM

## 2024-10-08 DIAGNOSIS — R0602 Shortness of breath: Secondary | ICD-10-CM

## 2024-10-08 DIAGNOSIS — M2550 Pain in unspecified joint: Secondary | ICD-10-CM

## 2024-10-08 LAB — URINE CULTURE

## 2024-10-08 LAB — NUSWAB VAGINITIS PLUS (VG+)
Candida albicans, NAA: NEGATIVE
Candida glabrata, NAA: NEGATIVE

## 2024-10-09 ENCOUNTER — Encounter: Payer: Self-pay | Admitting: Obstetrics and Gynecology

## 2024-10-11 ENCOUNTER — Ambulatory Visit: Payer: Self-pay | Admitting: Physician Assistant

## 2024-10-12 ENCOUNTER — Encounter: Payer: Self-pay | Admitting: Physician Assistant

## 2024-10-12 NOTE — Telephone Encounter (Signed)
 The pulmonary function test did come back normal. Keep follow up with Dr. Floretta as scheduled. Please verify with the patient to see if the heart monitor was completed, I want to make sure we get the result of the heart monitor before her next visit.

## 2024-10-13 ENCOUNTER — Ambulatory Visit (INDEPENDENT_AMBULATORY_CARE_PROVIDER_SITE_OTHER)

## 2024-10-13 VITALS — BP 119/53 | HR 71

## 2024-10-13 DIAGNOSIS — R3989 Other symptoms and signs involving the genitourinary system: Secondary | ICD-10-CM

## 2024-10-13 LAB — POCT URINALYSIS DIP (CLINITEK)
Bilirubin, UA: NEGATIVE
Glucose, UA: NEGATIVE mg/dL
Ketones, POC UA: NEGATIVE mg/dL
Leukocytes, UA: NEGATIVE
Nitrite, UA: NEGATIVE
POC PROTEIN,UA: NEGATIVE
Spec Grav, UA: 1.025
Urobilinogen, UA: 0.2 U/dL
pH, UA: 6

## 2024-10-13 MED ORDER — BUPIVACAINE HCL 0.25 % IJ SOLN
20.0000 mL | Freq: Once | INTRAMUSCULAR | Status: AC
  Administered 2024-10-13: 20 mL

## 2024-10-13 MED ORDER — SODIUM BICARBONATE 8.4 % IV SOLN
5.0000 mL | Freq: Once | INTRAVENOUS | Status: AC
  Administered 2024-10-13: 5 mL

## 2024-10-13 MED ORDER — HEPARIN SODIUM (PORCINE) 10000 UNIT/ML IJ SOLN
10000.0000 [IU] | Freq: Once | INTRAMUSCULAR | Status: AC
  Administered 2024-10-13: 10000 [IU] via INTRAVESICAL

## 2024-10-13 MED ORDER — LIDOCAINE HCL 2 % IJ SOLN
20.0000 mL | Freq: Once | INTRAMUSCULAR | Status: AC
  Administered 2024-10-13: 400 mg

## 2024-10-13 NOTE — Addendum Note (Signed)
 Addended byBETHA DUWAINE RIGGS on: 10/13/2024 10:16 AM   Modules accepted: Orders

## 2024-10-13 NOTE — Patient Instructions (Signed)
 Don't forget to hold the bladder instillation for 30-60 minutes!   It was a pleasure to see you today!  Thank you for trusting me with your care!

## 2024-10-13 NOTE — Progress Notes (Signed)
 Bladder Instillation: The patient was identified and verbally consented for the procedure.  The urethra was prepped with Betadine x 3. A 16 Fr foley catheter was inserted the bladder and drained for ___50_ cc. The foley was then attached to a 50 mL syringe with the plunger removed.  The medication was slowly poured into the bladder via the syringe and foley.  The medication consisted of: 20ml of Lidocaine  2%, 20mL of Bupivicaine 0.25%, 10,000 units/mL Heparin , 5mL Sodium Bicarbonate  8.4%.  The foley was removed and the patient was asked to hold the liquids in her bladder for 30-60 minutes if possible.   Precautions were given and patient was instructed to call the office or on-call number for any concerns.  Andree LULLA Boas, CMA

## 2024-10-15 ENCOUNTER — Telehealth: Payer: Self-pay

## 2024-10-15 ENCOUNTER — Other Ambulatory Visit (HOSPITAL_BASED_OUTPATIENT_CLINIC_OR_DEPARTMENT_OTHER): Payer: Self-pay

## 2024-10-15 ENCOUNTER — Other Ambulatory Visit: Payer: Self-pay | Admitting: Obstetrics and Gynecology

## 2024-10-15 ENCOUNTER — Other Ambulatory Visit: Payer: Self-pay

## 2024-10-15 DIAGNOSIS — R911 Solitary pulmonary nodule: Secondary | ICD-10-CM | POA: Insufficient documentation

## 2024-10-15 DIAGNOSIS — R3989 Other symptoms and signs involving the genitourinary system: Secondary | ICD-10-CM

## 2024-10-15 DIAGNOSIS — N3281 Overactive bladder: Secondary | ICD-10-CM

## 2024-10-15 MED ORDER — MIRABEGRON ER 50 MG PO TB24
50.0000 mg | ORAL_TABLET | Freq: Every day | ORAL | 5 refills | Status: DC
  Filled 2024-10-15: qty 30, 30d supply, fill #0

## 2024-10-15 NOTE — Telephone Encounter (Signed)
 Patient called stating her medication is about $200 and its to expensive for her. She wanted to know if there is another pharmacy that would be cost effective.  Please advise

## 2024-10-15 NOTE — Progress Notes (Signed)
 Beth Holmes,   No acute findings on CT of chest.  You do have right lobe pulmonary nodule but not causing your symptoms. You are not at high risk for lung cancer so don't need follow up. Will send to PCP.

## 2024-10-20 ENCOUNTER — Other Ambulatory Visit (HOSPITAL_BASED_OUTPATIENT_CLINIC_OR_DEPARTMENT_OTHER): Payer: Self-pay

## 2024-10-21 ENCOUNTER — Ambulatory Visit

## 2024-10-21 DIAGNOSIS — R0789 Other chest pain: Secondary | ICD-10-CM

## 2024-10-21 DIAGNOSIS — R000 Tachycardia, unspecified: Secondary | ICD-10-CM

## 2024-10-22 ENCOUNTER — Other Ambulatory Visit (HOSPITAL_BASED_OUTPATIENT_CLINIC_OR_DEPARTMENT_OTHER): Payer: Self-pay

## 2024-10-22 MED ORDER — METOPROLOL SUCCINATE ER 50 MG PO TB24
50.0000 mg | ORAL_TABLET | Freq: Every day | ORAL | 3 refills | Status: AC
  Filled 2024-10-22: qty 90, 90d supply, fill #0

## 2024-10-22 NOTE — Progress Notes (Signed)
 Find out when cardiology appt is? Are you checking HR at home? What are the ranges?

## 2024-10-22 NOTE — Progress Notes (Signed)
 Beth Holmes,   Your triggered episodes were in response to heart rate being fast. How are you feeling on metoprolol ? This should be helping slow heart rate down some.

## 2024-10-22 NOTE — Progress Notes (Signed)
 Spoke with patient . States her cardiology appt is January 16th.  She states at rest her heart rate is 95-100 but with standing up or any exertion averaging between 120-140. Just of note patient had episode of stuttering when giving me these number and I did not see anything listed in the patient history so was not sure if this was normal for her.

## 2024-10-25 ENCOUNTER — Ambulatory Visit

## 2024-10-25 VITALS — BP 121/82 | HR 92

## 2024-10-25 DIAGNOSIS — R3989 Other symptoms and signs involving the genitourinary system: Secondary | ICD-10-CM | POA: Diagnosis not present

## 2024-10-25 LAB — POCT URINALYSIS DIP (CLINITEK)
Bilirubin, UA: NEGATIVE
Glucose, UA: NEGATIVE mg/dL
Ketones, POC UA: NEGATIVE mg/dL
Leukocytes, UA: NEGATIVE
Nitrite, UA: NEGATIVE
Spec Grav, UA: >=1.03 — AB
Urobilinogen, UA: 0.2 U/dL
pH, UA: 6

## 2024-10-25 MED ORDER — SODIUM BICARBONATE 8.4 % IV SOLN
5.0000 mL | Freq: Once | INTRAVENOUS | Status: AC
  Administered 2024-10-25: 5 mL

## 2024-10-25 MED ORDER — LIDOCAINE HCL URETHRAL/MUCOSAL 2 % EX GEL: 1.0000 | Freq: Once | CUTANEOUS | Status: AC

## 2024-10-25 MED ORDER — HEPARIN SODIUM (PORCINE) 10000 UNIT/ML IJ SOLN
10000.0000 [IU] | Freq: Once | INTRAMUSCULAR | Status: AC
  Administered 2024-10-25: 10000 [IU] via INTRAVESICAL

## 2024-10-25 MED ORDER — BUPIVACAINE HCL 0.25 % IJ SOLN
20.0000 mL | Freq: Once | INTRAMUSCULAR | Status: AC
  Administered 2024-10-25: 20 mL

## 2024-10-25 MED ORDER — LIDOCAINE HCL 2 % IJ SOLN
20.0000 mL | Freq: Once | INTRAMUSCULAR | Status: AC
  Administered 2024-10-25: 400 mg

## 2024-10-25 NOTE — Progress Notes (Signed)
 Bladder Instillation: The patient was identified and verbally consented for the procedure.  The urethra was prepped with Betadine x 3. A 16 Fr foley catheter was inserted the bladder and drained for less than 10 cc. The foley was then attached to a 50 mL syringe with the plunger removed.  The medication was slowly poured into the bladder via the syringe and foley.  The medication consisted of: 20ml of Lidocaine  2%, 20mL of Bupivicaine 0.25%, 10,000 units/mL Heparin , 5mL Sodium Bicarbonate  8.4%.  The foley was removed and the patient was asked to hold the liquids in her bladder for 30-60 minutes if possible.   She did have spasms with catheter insertion and I used Lidocaine  2% jelly. Which she states did help.  Precautions were given and patient was instructed to call the office or on-call number for any concerns.  Andree LULLA Boas, CMA

## 2024-10-25 NOTE — Patient Instructions (Signed)
 Please keep all scheduled follow ups.  It was a pleasure to see you today!  Thank you for trusting me with your care!

## 2024-10-28 NOTE — Progress Notes (Signed)
 " Cardiology Office Note:  .   Date:  10/28/2024  ID:  Larraine CHRISTELLA Georgi, DOB 2001/06/22, MRN [id number] PCP: Willo Mini, NP  [facility] Providers Cardiologist:  None { Chief Complaint: No chief complaint on file.   History of Present Illness: Beth Holmes is a 24 y.o. female with a PMH of asthma, morbid obesity, and endometriosis who presents for follow-up.   Pre-Chart Notes: - Septal wall thickness of 1.3 cm noted, but it is clearly over measured on my review. - Negative PFTs and heart monitor  Discussed the use of AI scribe software for clinical note transcription with the patient, who gave verbal consent to proceed.  History of Present Illness       Studies Reviewed: SABRA    EKG: ***       Cardiac Studies & Procedures   ______________________________________________________________________________________________     ECHOCARDIOGRAM  ECHOCARDIOGRAM COMPLETE 09/27/2024  Narrative ECHOCARDIOGRAM REPORT    Patient Name:   INIKA BELLANGER Date of Exam: 09/27/2024 Medical Rec #:  [id number]       Height:       62.0 in Accession #:    [id number]      Weight:       248.2 lb Date of Birth:  21-Feb-2001      BSA:          2.095 m Patient Age:    23 years        BP:           118/78 mmHg Patient Gender: F               HR:           78 bpm. Exam Location:  [address]  Procedure: 2D Echo, Cardiac Doppler and Color Doppler (Both Spectral and Color Flow Doppler were utilized during procedure).  Indications:    R06.00 SOB  History:        Patient has no prior history of Echocardiogram examinations. Arrythmias:Tachycardia, Signs/Symptoms:Shortness of Breath and Atypical chest pain; Risk Factors:Morbid obesity.  Sonographer:    Elsie Bohr RDCS Referring Phys: [id number] HAO MENG   Sonographer Comments: Technically difficult study due to poor echo windows and patient is obese. Image acquisition challenging due to patient body  habitus. IMPRESSIONS   1. Left ventricular ejection fraction, by estimation, is 60 to 65%. The left ventricle has normal function. The left ventricle has no regional wall motion abnormalities. There is mild left ventricular hypertrophy of the septal segment. Left ventricular diastolic parameters were normal. 2. Right ventricular systolic function is normal. The right ventricular size is normal. 3. The mitral valve is normal in structure. No evidence of mitral valve regurgitation. No evidence of mitral stenosis. 4. The aortic valve is tricuspid. Aortic valve regurgitation is not visualized. No aortic stenosis is present. 5. The inferior vena cava is normal in size with greater than 50% respiratory variability, suggesting right atrial pressure of 3 mmHg.  FINDINGS Left Ventricle: Left ventricular ejection fraction, by estimation, is 60 to 65%. The left ventricle has normal function. The left ventricle has no regional wall motion abnormalities. The left ventricular internal cavity size was normal in size. There is mild left ventricular hypertrophy of the septal segment. Left ventricular diastolic parameters were normal. Normal left ventricular filling pressure.  Right Ventricle: The right ventricular size is normal. No increase in right ventricular wall thickness. Right ventricular systolic function is normal.  Left Atrium: Left atrial size was  normal in size.  Right Atrium: Right atrial size was normal in size.  Pericardium: There is no evidence of pericardial effusion.  Mitral Valve: The mitral valve is normal in structure. No evidence of mitral valve regurgitation. No evidence of mitral valve stenosis.  Tricuspid Valve: The tricuspid valve is normal in structure. Tricuspid valve regurgitation is not demonstrated. No evidence of tricuspid stenosis.  Aortic Valve: The aortic valve is tricuspid. Aortic valve regurgitation is not visualized. No aortic stenosis is present.  Pulmonic Valve:  The pulmonic valve was normal in structure. Pulmonic valve regurgitation is not visualized. No evidence of pulmonic stenosis.  Aorta: The aortic root is normal in size and structure.  Venous: The inferior vena cava is normal in size with greater than 50% respiratory variability, suggesting right atrial pressure of 3 mmHg.  IAS/Shunts: No atrial level shunt detected by color flow Doppler.   LEFT VENTRICLE PLAX 2D LVIDd:         4.30 cm   Diastology LVIDs:         2.60 cm   LV e' medial:    11.70 cm/s LV PW:         1.00 cm   LV E/e' medial:  7.2 LV IVS:        1.30 cm   LV e' lateral:   16.00 cm/s LVOT diam:     2.30 cm   LV E/e' lateral: 5.3 LV SV:         93 LV SV Index:   44 LVOT Area:     4.15 cm   RIGHT VENTRICLE RV S prime:     13.70 cm/s TAPSE (M-mode): 2.1 cm  LEFT ATRIUM             Index        RIGHT ATRIUM           Index LA diam:        3.20 cm 1.53 cm/m   RA Pressure: 3.00 mmHg LA Vol (A2C):   30.1 ml 14.36 ml/m  RA Area:     10.00 cm LA Vol (A4C):   34.6 ml 16.51 ml/m  RA Volume:   22.10 ml  10.55 ml/m LA Biplane Vol: 32.5 ml 15.51 ml/m AORTIC VALVE LVOT Vmax:   105.00 cm/s LVOT Vmean:  73.300 cm/s LVOT VTI:    0.223 m  AORTA Ao Root diam: 3.10 cm Ao Asc diam:  2.70 cm  MITRAL VALVE               TRICUSPID VALVE MV Area (PHT): 2.72 cm    Estimated RAP:  3.00 mmHg MV Decel Time: 279 msec MV E velocity: 84.40 cm/s  SHUNTS MV A velocity: 65.00 cm/s  Systemic VTI:  0.22 m MV E/A ratio:  1.30        Systemic Diam: 2.30 cm  Annabella Scarce MD Electronically signed by Annabella Scarce MD Signature Date/Time: 09/28/2024/1:51:55 PM    Final    MONITORS  LONG TERM MONITOR (3-14 DAYS) 10/19/2024  Narrative Patch Wear Time:  13 days and 9 hours (2025-12-10T14:31:46-0500 to 2025-12-24T00:25:48-0500)  HR 60 - 171, average 95 bpm. There were no sustained or non-sustained arrhythmias. No atrial fibrillation detected. Rare supraventricular  ectopy. Rare ventricular ectopy. There were a total of 158 symptom trigger episodes, which correspond to sinus rhythm/sinus tachycardia.   Fonda Kitty Cardiac Electrophysiology       ______________________________________________________________________________________________      Results   Risk Assessment/Calculations:    {  Does this patient have ATRIAL FIBRILLATION?[phone]} No BP recorded.  {Refresh Note OR Click here to enter BP  :1}***        Physical Exam:    VS:  There were no vitals taken for this visit. ***    Wt Readings from Last 3 Encounters:  10/05/24 249 lb 1.9 oz (113 kg)  09/24/24 248 lb 3.2 oz (112.6 kg)  09/22/24 235 lb (106.6 kg)     GEN: Well nourished, well developed, in no acute distress NECK: No JVD; No carotid bruits CARDIAC: ***RRR, no murmurs, rubs, gallops RESPIRATORY:  Clear to auscultation without rales, wheezing or rhonchi  ABDOMEN: Soft, non-tender, non-distended, normal bowel sounds EXTREMITIES:  Warm and well perfused, no edema; No deformity, 2+ radial pulses PSYCH: Normal mood and affect   ASSESSMENT AND PLAN: .    #Atypical Chest Pain Stress CMRI     {Are you ordering a CV Procedure (e.g. stress test, cath, DCCV, TEE, etc)?   Press F2        :[id number]}   This note was written with the assistance of a dictation microphone or AI dictation software. Please excuse any typos or grammatical errors.   Signed, Georganna Archer, MD  10/28/2024 8:39 PM    [facility] "

## 2024-10-29 ENCOUNTER — Ambulatory Visit
Payer: Self-pay | Attending: Student in an Organized Health Care Education/Training Program | Admitting: Student in an Organized Health Care Education/Training Program

## 2024-10-29 ENCOUNTER — Encounter: Payer: Self-pay | Admitting: Student in an Organized Health Care Education/Training Program

## 2024-10-29 ENCOUNTER — Other Ambulatory Visit (HOSPITAL_BASED_OUTPATIENT_CLINIC_OR_DEPARTMENT_OTHER): Payer: Self-pay

## 2024-10-29 VITALS — BP 117/75 | HR 75 | Ht 63.0 in | Wt 249.0 lb

## 2024-10-29 DIAGNOSIS — R072 Precordial pain: Secondary | ICD-10-CM | POA: Diagnosis not present

## 2024-10-29 DIAGNOSIS — R002 Palpitations: Secondary | ICD-10-CM

## 2024-10-29 MED ORDER — LORAZEPAM 1 MG PO TABS: ORAL_TABLET | ORAL | 0 refills | Status: AC

## 2024-10-29 NOTE — Patient Instructions (Addendum)
 MEDICATION:  ON DAY OF MRI:  ATIVAN  1 MG: TAKE ONE TO TWO TABLETS FOR ANXIETY 30 MINUTES BEFORE CARDIAC MRI.    Lab Work: CBC  If you have labs (blood work) drawn today and your tests are completely normal, you will receive your results only by: MyChart Message (if you have MyChart) OR A paper copy in the mail If you have any lab test that is abnormal or we need to change your treatment, we will call you to review the results.  Testing/Procedures:  GXT (EXERCISE TOLERANCE TEST)  Exercise Tolerance Test  Please arrive 15 minutes prior to your appointment time for registration and insurance purposes.  The test will take approximately 45 minutes to complete.  How to prepare for your Exercise Stress Test: Do bring a list of your current medications with you.  If not listed below, you may take your medications as normal. Do wear comfortable clothes (no dresses or overalls) and walking shoes, tennis shoes preferred (no heels or open toed shoes are allowed) Do Not wear cologne, perfume, aftershave or lotions (deodorant is allowed). Please report to [facility] for your test. HOLD metoprolol  morning of test   If these instructions are not followed, your test will have to be rescheduled.  If you have questions or concerns about your appointment, you can call the Stress Lab at [phone].  If you cannot keep your appointment, please provide 24 hours notification to the Stress Lab, to avoid a possible $50 charge to your account.   CARDIAC MRI    You are scheduled for Cardiac MRI at the location below.    [facility] [address] [place], [place] [address] Please take advantage of the free valet parking available at the [facility] and [employer] (Entrance C).  Proceed to the [facility] Radiology Department (First Floor) for check-in.   Magnetic resonance imaging (MRI) is a painless test that produces images of the inside of the body without using  Xrays.  During an MRI, strong magnets and radio waves work together in a data processing manager to form detailed images.   MRI images may provide more details about a medical condition than X-rays, CT scans, and ultrasounds can provide.  You may be given earphones to listen for instructions.  You may eat a light breakfast and take medications as ordered with the exception of furosemide, hydrochlorothiazide, chlorthalidone or spironolactone (or any other fluid pill). If you are undergoing a stress MRI, please avoid stimulants for 12 hr prior to test. (I.e. Caffeine, nicotine, chocolate, or antihistamine medications)  If your provider has ordered anti-anxiety medications for this test, then you will need a driver.  An IV will be inserted into one of your veins. Contrast material will be injected into your IV. It will leave your body through your urine within a day. You may be told to drink plenty of fluids to help flush the contrast material out of your system.  You will be asked to remove all metal, including: Watch, jewelry, and other metal objects including hearing aids, hair pieces and dentures. Also wearable glucose monitoring systems (ie. Freestyle Libre and Omnipods) (Braces and fillings normally are not a problem.)   TEST WILL TAKE APPROXIMATELY 1 HOUR  PLEASE NOTIFY SCHEDULING AT LEAST 24 HOURS IN ADVANCE IF YOU ARE UNABLE TO KEEP YOUR APPOINTMENT. [phone]  For more information and frequently asked questions, please visit our website : [email or web address]  Please call the Cardiac Imaging Nurse Navigators with any questions/concerns. [phone] Office  Follow-Up: At [facility], you and your health needs are our priority.  As part of our continuing mission to provide you with exceptional heart care, our providers are all part of one team.  This team includes your primary Cardiologist (physician) and Advanced Practice Providers or APPs (Physician Assistants  and Nurse Practitioners) who all work together to provide you with the care you need, when you need it.  Your next appointment:   3 month(s)  Provider:   Georganna Archer, MD

## 2024-10-30 ENCOUNTER — Encounter: Payer: Self-pay | Admitting: Medical-Surgical

## 2024-10-30 LAB — CBC
Hematocrit: 38.6 % (ref 34.0–46.6)
Hemoglobin: 12.5 g/dL (ref 11.1–15.9)
MCH: 25.7 pg — ABNORMAL LOW (ref 26.6–33.0)
MCHC: 32.4 g/dL (ref 31.5–35.7)
MCV: 79 fL (ref 79–97)
Platelets: 249 x10E3/uL (ref 150–450)
RBC: 4.87 x10E6/uL (ref 3.77–5.28)
RDW: 13.4 % (ref 11.7–15.4)
WBC: 7.8 x10E3/uL (ref 3.4–10.8)

## 2024-10-31 ENCOUNTER — Ambulatory Visit: Payer: Self-pay | Admitting: Student in an Organized Health Care Education/Training Program

## 2024-11-01 ENCOUNTER — Ambulatory Visit

## 2024-11-01 ENCOUNTER — Other Ambulatory Visit: Payer: Self-pay | Admitting: Medical-Surgical

## 2024-11-01 VITALS — BP 120/72 | HR 73

## 2024-11-01 DIAGNOSIS — R3989 Other symptoms and signs involving the genitourinary system: Secondary | ICD-10-CM | POA: Diagnosis not present

## 2024-11-01 LAB — POCT URINALYSIS DIP (CLINITEK)
Bilirubin, UA: NEGATIVE
Blood, UA: NEGATIVE
Glucose, UA: NEGATIVE mg/dL
Ketones, POC UA: NEGATIVE mg/dL
Leukocytes, UA: NEGATIVE
Nitrite, UA: NEGATIVE
POC PROTEIN,UA: NEGATIVE
Spec Grav, UA: 1.025
Urobilinogen, UA: 0.2 U/dL
pH, UA: 5.5

## 2024-11-01 MED ORDER — BUPIVACAINE HCL 0.25 % IJ SOLN
20.0000 mL | Freq: Once | INTRAMUSCULAR | Status: AC
  Administered 2024-11-01: 20 mL

## 2024-11-01 MED ORDER — LIDOCAINE HCL 2 % IJ SOLN
20.0000 mL | Freq: Once | INTRAMUSCULAR | Status: AC
  Administered 2024-11-01: 400 mg

## 2024-11-01 MED ORDER — SODIUM BICARBONATE 8.4 % IV SOLN
5.0000 mL | Freq: Once | INTRAVENOUS | Status: AC
  Administered 2024-11-01: 5 mL

## 2024-11-01 MED ORDER — HEPARIN SODIUM (PORCINE) 10000 UNIT/ML IJ SOLN
10000.0000 [IU] | Freq: Once | INTRAMUSCULAR | Status: AC
  Administered 2024-11-01: 10000 [IU] via INTRAVESICAL

## 2024-11-01 NOTE — Patient Instructions (Signed)
 Thank you for entrusting us  with your care. Please keep all future appointments and if you have any questions or concerns please feel free to contact our office at [phone].

## 2024-11-01 NOTE — Progress Notes (Signed)
 Bladder Instillation: The patient was identified and verbally consented for the procedure.  The urethra was prepped with Betadine x 3. A 16 Fr foley catheter was inserted the bladder and drained for 40 cc. The foley was then attached to a 60 mL syringe with the plunger removed.  The medication was slowly poured into the bladder via the syringe and foley.  The medication consisted of: 20ml of Lidocaine  2%, 20mL of Bupivicaine 0.25%, 10,000 units/mL Heparin , 5mL Sodium Bicarbonate  8.4%.  The foley was removed and the patient was asked to hold the liquids in her bladder for 30-60 minutes if possible.   Precautions were given and patient was instructed to call the office or on-call number for any concerns.  Darla LOISE Pastor, CMA

## 2024-11-02 ENCOUNTER — Other Ambulatory Visit (HOSPITAL_BASED_OUTPATIENT_CLINIC_OR_DEPARTMENT_OTHER): Payer: Self-pay

## 2024-11-02 MED ORDER — GABAPENTIN 300 MG PO CAPS
300.0000 mg | ORAL_CAPSULE | Freq: Every day | ORAL | 3 refills | Status: AC
  Filled 2024-11-02: qty 30, 30d supply, fill #0

## 2024-11-03 ENCOUNTER — Other Ambulatory Visit (HOSPITAL_BASED_OUTPATIENT_CLINIC_OR_DEPARTMENT_OTHER): Payer: Self-pay

## 2024-11-03 NOTE — Therapy (Incomplete)
 " OUTPATIENT PHYSICAL THERAPY FEMALE PELVIC EVALUATION   Patient Name: Beth Holmes MRN: [id number] DOB:2001-01-01, 24 y.o., female Today's Date: 11/03/2024  END OF SESSION:   Past Medical History:  Diagnosis Date   Anxiety    Asthma    Chronic pelvic pain in female    Depression    Ovarian cyst    rt side   Past Surgical History:  Procedure Laterality Date   ADENOIDECTOMY     OVARIAN CYST SURGERY     TONSILLECTOMY     UPPER GASTROINTESTINAL ENDOSCOPY  08/11/2020   Patient Active Problem List   Diagnosis Date Noted   Right lower lobe pulmonary nodule 10/15/2024   Tremor of both hands 09/15/2024   Atypical chest pain 09/15/2024   History of ulcer disease 09/15/2024   Facial rash 09/15/2024   Abnormal weight gain 09/15/2024   Tachycardia 09/15/2024   Acute non-recurrent maxillary sinusitis 08/30/2024   Dyspnea on exertion 08/30/2024   OAB (overactive bladder) 08/12/2024   Levator spasm 08/12/2024   Endometriosis 08/12/2024   Epigastric pain 12/16/2022   Pain radiating to back 12/16/2022   Vomiting without nausea 12/13/2022   Chronic pansinusitis 11/27/2022   Chronic allergic rhinitis 11/27/2022   Irritable bowel syndrome with diarrhea 12/20/2020   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  Rationale for Evaluation and Treatment: Rehabilitation   ONSET DATE: ***   SUBJECTIVE:                                                                                                                                                                                            SUBJECTIVE STATEMENT:   PAIN:  Are you having  pain? {yes/no:20286} NPRS scale: ***/10 Pain location: {pelvic pain location:27098}   Pain type: {type:313116} Pain description: {PAIN DESCRIPTION:21022940}    Aggravating factors: *** Relieving factors: ***   PRECAUTIONS: None   RED FLAGS: None       WEIGHT BEARING RESTRICTIONS: No   FALLS:  Has patient fallen in last 6 months? No   OCCUPATION: ***   ACTIVITY LEVEL : ***   PLOF: Independent   PATIENT GOALS: ***   PERTINENT HISTORY:  ***   BOWEL MOVEMENT: Pain with bowel movement: {yes/no:20286} Type of bowel movement:{PT BM type:27100} Fully  empty rectum: {No/Yes:304960894} Leakage: {Yes/No:304960894} Urgency: {Yes/No:304960894} Pads: {Yes/No:304960894} Fiber supplement/laxative {YES/NO AS:20300}   URINATION: Pain with urination: {yes/no:20286} Fully empty bladder: {Yes/No:304960894} Stream: {PT urination:27102} Urgency: {YES/NO AS:20300} Frequency: *** Nocturia: *** Fluid Intake: *** Leakage: {PT leakage:27103} Pads: {Yes/No:304960894}   INTERCOURSE:             Ability to have vaginal penetration {YES/NO:21197} Pain with intercourse: {pain with intercourse PA:27099} Dryness{YES/NO AS:20300} Climax: *** Marinoff Scale: ***/3 Lubricant: ***   PREGNANCY: Vaginal deliveries *** Tearing {Yes***/No:304960894} Episiotomy {YES/NO AS:20300} C-section deliveries *** Currently pregnant {Yes***/No:304960894}   PROLAPSE: {PT prolapse:27101}     OBJECTIVE:  Note: Objective measures were completed at Evaluation unless otherwise noted.    PATIENT SURVEYS:    PFIQ-7: ***   COGNITION: Overall cognitive status: Within functional limits for tasks assessed                          SENSATION: Light touch: Appears intact     FUNCTIONAL TESTS:  Squat: *** Single leg stance:             Rt: ***             Lt: *** Curl-up test: *** Sit-up test: *** Active straight leg raise: ***     GAIT: Assistive device utilized: None Comments: WNL    POSTURE: {posture:25561}     LUMBARAROM/PROM:   A/PROM A/PROM  Eval (% available)  Flexion    Extension    Right lateral flexion    Left lateral flexion    Right rotation    Left rotation     (Blank rows = not tested)   PALPATION: General: ***   Abdominal: Breathing: *** Tenderness: *** Scar tissue: *** Diastasis: ***                 External Perineal Exam: ***                             Internal Pelvic Floor: ***   Patient confirms identification and approves PT to assess internal pelvic floor and treatment {yes/no:20286}   PELVIC MMT:   MMT eval  Vaginal    Internal Anal Sphincter    External Anal Sphincter    Puborectalis    (Blank rows = not tested)         TONE: ***   PROLAPSE: ***   TODAY'S TREATMENT:                                                                                                                              DATE:   EVAL  Manual:   Neuromuscular re-education:   Exercises:   Therapeutic activities:         PATIENT EDUCATION:  Education details: See above Person educated: Patient Education method: Explanation, Demonstration, Tactile cues, Verbal cues, and Handouts Education comprehension: verbalized understanding   HOME  EXERCISE PROGRAM: ***   ASSESSMENT:   CLINICAL IMPRESSION: Patient is a ***  who was seen today for physical therapy evaluation and treatment for ***.    OBJECTIVE IMPAIRMENTS: decreased activity tolerance, decreased coordination, decreased endurance, decreased mobility, decreased ROM, decreased strength, increased fascial restrictions, increased muscle spasms, impaired flexibility, impaired tone, improper body mechanics, postural dysfunction, and pain.    ACTIVITY LIMITATIONS: {activitylimitations:27494}   PARTICIPATION LIMITATIONS: {participationrestrictions:25113}   PERSONAL FACTORS: {Personal factors:25162} are also affecting patient's functional outcome.    REHAB POTENTIAL: Good   CLINICAL  DECISION MAKING: Stable/uncomplicated   EVALUATION COMPLEXITY: Low     GOALS: Goals reviewed with patient? Yes   SHORT TERM GOALS: Target date: 12/01/2024       Pt will be independent with HEP in order to improve activity tolerance.    Baseline: Goal status: INITIAL   2.  ***   Baseline:  Goal status: INITIAL   3.  ***   Baseline:  Goal status: INITIAL   4.  *** Baseline:  Goal status: INITIAL   5.  *** Baseline:  Goal status: INITIAL   6.  *** Baseline:  Goal status: INITIAL   LONG TERM GOALS: Target date:    Pt will be independent with advanced HEP in order to improve activity tolerance.    Baseline:  Goal status: INITIAL   2.  *** Baseline:  Goal status: INITIAL   3.  *** Baseline:  Goal status: INITIAL   4.  *** Baseline:  Goal status: INITIAL   5.  *** Baseline:  Goal status: INITIAL   6.  *** Baseline:  Goal status: INITIAL   PLAN:   PT FREQUENCY: 1-2x/week   PT DURATION: ***    PLANNED INTERVENTIONS: 97164- PT Re-evaluation, 97110-Therapeutic exercises, 97530- Therapeutic activity, 97112- Neuromuscular re-education, 97535- Self Care, 02859- Manual therapy, [id number]- Gait training, [phone]- Aquatic Therapy, H9716- Electrical stimulation (unattended), [id number]- Traction (mechanical), [id number]- Ionotophoresis 4mg /ml Dexamethasone , 79439 (1-2 muscles), 20561 (3+ muscles)- Dry Needling, Patient/Family education, Balance training, Taping, Joint mobilization, Joint manipulation, Spinal manipulation, Spinal mobilization, Scar mobilization, Vestibular training, Cryotherapy, Moist heat, and Biofeedback   PLAN FOR NEXT SESSION: ***   Loriene Taunton, PT, DPT 11/03/24 5:04 PM  [facility] [address] [place], [place] [address] Phone # [phone] Fax [phone]      Alcide Memoli, PT 11/03/2024, 5:04 PM  "

## 2024-11-04 ENCOUNTER — Telehealth: Payer: Self-pay | Admitting: Student in an Organized Health Care Education/Training Program

## 2024-11-04 ENCOUNTER — Ambulatory Visit: Payer: Self-pay | Admitting: Physical Therapy

## 2024-11-04 ENCOUNTER — Telehealth: Payer: Self-pay | Admitting: Physical Therapy

## 2024-11-04 NOTE — Telephone Encounter (Signed)
 Kelly with [employer] calling to ask the diagnostic code for patients heart monitor. They have received two codes but were advised they were not billable R07.89 R00.0. Please advise.   [phone]

## 2024-11-04 NOTE — Telephone Encounter (Signed)
 Spoke with patient re no show for her eval, she  thought it was next week, she will call and reschedule.

## 2024-11-04 NOTE — Telephone Encounter (Signed)
 Provided [employer] with the additional diagnosis of palpitations.  Patient was referred to our office by Zada Palin, NP for palpitations, precordial pain , and shortness of breath.

## 2024-11-04 NOTE — Telephone Encounter (Signed)
 Looks like primary care ordered the heart monitor.

## 2024-11-09 ENCOUNTER — Ambulatory Visit

## 2024-11-10 ENCOUNTER — Ambulatory Visit: Admitting: Physician Assistant

## 2024-11-11 ENCOUNTER — Ambulatory Visit

## 2024-11-11 ENCOUNTER — Encounter: Payer: Self-pay | Admitting: Obstetrics and Gynecology

## 2024-11-12 ENCOUNTER — Encounter (HOSPITAL_COMMUNITY): Payer: Self-pay

## 2024-11-16 ENCOUNTER — Ambulatory Visit (HOSPITAL_COMMUNITY): Admission: RE | Admit: 2024-11-16 | Source: Ambulatory Visit

## 2024-11-17 ENCOUNTER — Encounter: Payer: Self-pay | Admitting: Medical-Surgical

## 2024-11-17 ENCOUNTER — Ambulatory Visit

## 2024-11-18 ENCOUNTER — Encounter (HOSPITAL_COMMUNITY): Payer: Self-pay | Admitting: *Deleted

## 2024-11-22 ENCOUNTER — Ambulatory Visit

## 2024-11-24 ENCOUNTER — Ambulatory Visit (HOSPITAL_COMMUNITY)

## 2024-11-26 ENCOUNTER — Encounter (HOSPITAL_COMMUNITY)

## 2024-12-28 ENCOUNTER — Ambulatory Visit: Admitting: Physical Therapy

## 2025-01-19 ENCOUNTER — Ambulatory Visit: Payer: Self-pay

## 2025-01-28 ENCOUNTER — Ambulatory Visit: Admitting: Student in an Organized Health Care Education/Training Program
